# Patient Record
Sex: Female | Born: 1995 | Race: Black or African American | Hispanic: No | Marital: Married | Smoking: Never smoker
Health system: Southern US, Community
[De-identification: ages and names within clinical notes are randomized; demographics above are authoritative.]

## PROBLEM LIST (undated history)

## (undated) DIAGNOSIS — K5900 Constipation, unspecified: Secondary | ICD-10-CM

## (undated) DIAGNOSIS — J45909 Unspecified asthma, uncomplicated: Secondary | ICD-10-CM

## (undated) DIAGNOSIS — R87610 Atypical squamous cells of undetermined significance on cytologic smear of cervix (ASC-US): Secondary | ICD-10-CM

## (undated) DIAGNOSIS — T7840XA Allergy, unspecified, initial encounter: Secondary | ICD-10-CM

## (undated) DIAGNOSIS — J302 Other seasonal allergic rhinitis: Secondary | ICD-10-CM

## (undated) HISTORY — DX: Constipation, unspecified: K59.00

## (undated) HISTORY — DX: Atypical squamous cells of undetermined significance on cytologic smear of cervix (ASC-US): R87.610

## (undated) HISTORY — DX: Allergy, unspecified, initial encounter: T78.40XA

## (undated) HISTORY — PX: WISDOM TOOTH EXTRACTION: SHX21

---
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
## 2015-03-03 IMAGING — CR DG WRIST COMPLETE 3+V*L*
4 series · 4 of 4 positions shown · non-contrast
Comparison: None.

CLINICAL DATA: Left wrist pain.

LEFT WRIST - COMPLETE 3+ VIEW

[x wrist pa left]
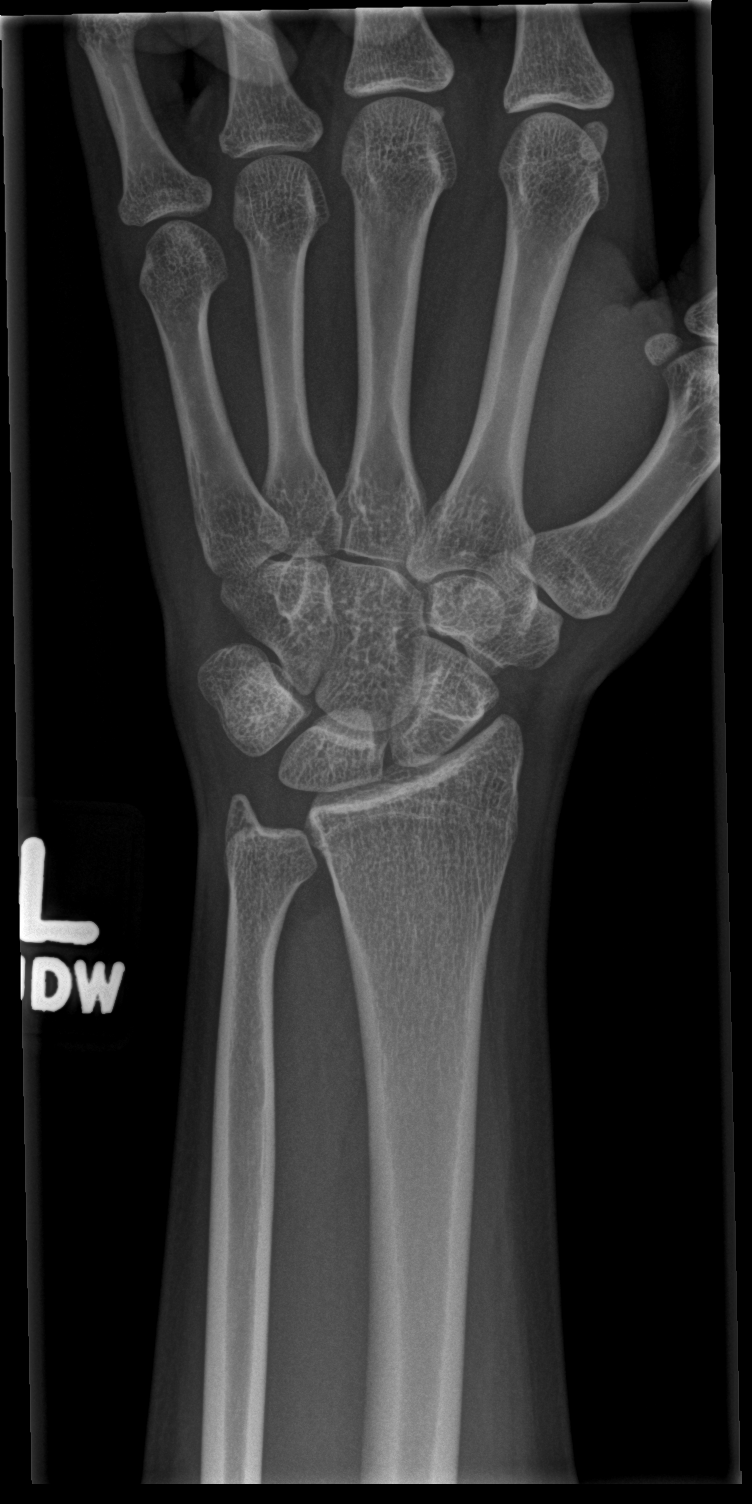

[x wrist obl left]
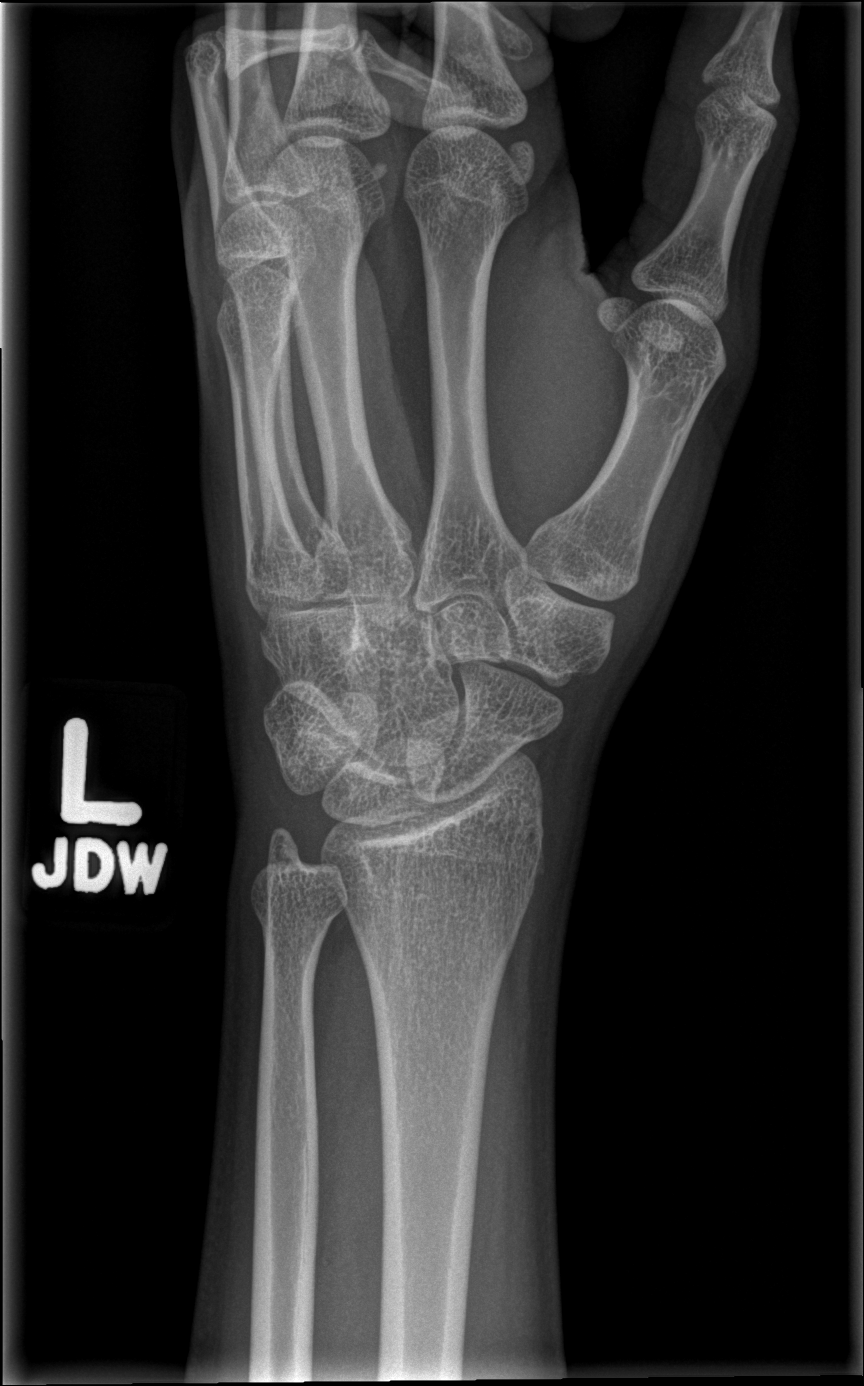

[x wrist lat left]
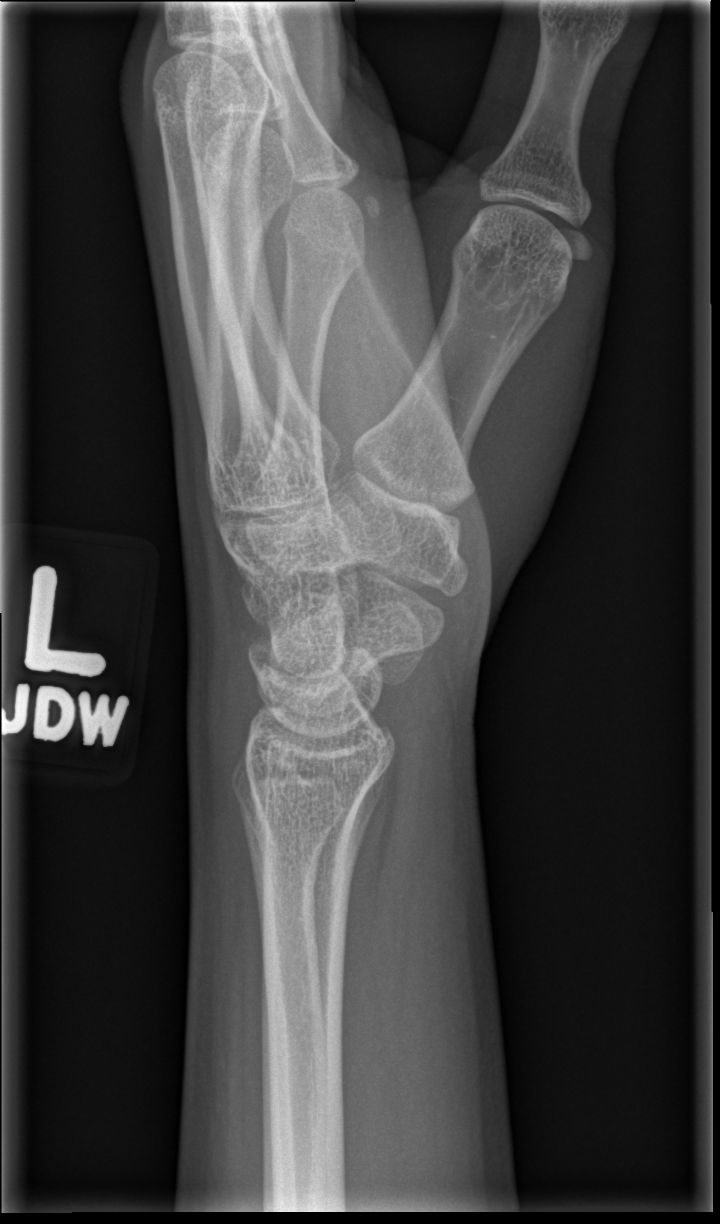

[x wrist navicular view left]
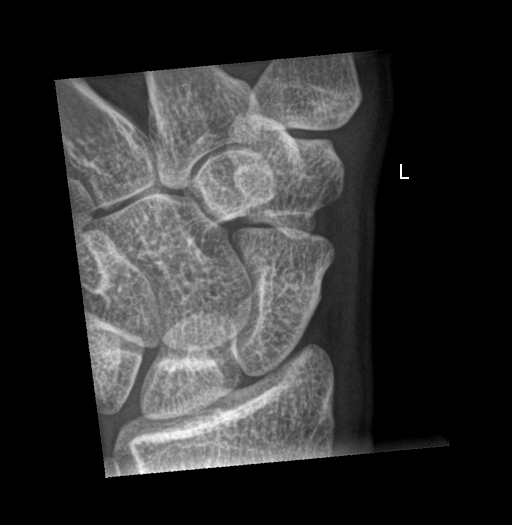

[4 of 4 positions shown; findings below may reference images not displayed]

FINDINGS: Four views of the left wrist were obtained.  No evidence
for fracture or dislocation.  Alignment of the wrist is within
normal limits.  No gross soft tissue abnormality.
IMPRESSION: No acute bony abnormality.

## 2017-01-09 ENCOUNTER — Other Ambulatory Visit: Payer: Self-pay | Admitting: General Practice

## 2017-01-09 ENCOUNTER — Encounter: Payer: Self-pay | Admitting: General Practice

## 2017-04-29 ENCOUNTER — Ambulatory Visit: Payer: Medicaid Other | Admitting: Family Medicine

## 2019-07-01 ENCOUNTER — Other Ambulatory Visit: Payer: Self-pay

## 2019-07-01 ENCOUNTER — Ambulatory Visit (INDEPENDENT_AMBULATORY_CARE_PROVIDER_SITE_OTHER): Payer: 59 | Admitting: Physician Assistant

## 2019-07-01 ENCOUNTER — Encounter: Payer: Self-pay | Admitting: Physician Assistant

## 2019-07-01 VITALS — BP 100/60 | HR 81 | Temp 98.3°F | Resp 14 | Ht 65.0 in | Wt 127.0 lb

## 2019-07-01 DIAGNOSIS — R300 Dysuria: Secondary | ICD-10-CM

## 2019-07-01 LAB — POCT URINALYSIS DIPSTICK
Bilirubin, UA: NEGATIVE
Blood, UA: NEGATIVE
Glucose, UA: NEGATIVE
Ketones, UA: NEGATIVE
Leukocytes, UA: NEGATIVE
Nitrite, UA: NEGATIVE
Protein, UA: POSITIVE — AB
Spec Grav, UA: 1.025 (ref 1.010–1.025)
Urobilinogen, UA: 0.2 E.U./dL
pH, UA: 6 (ref 5.0–8.0)

## 2019-07-01 MED ORDER — CEPHALEXIN 500 MG PO CAPS: 500.0000 mg | ORAL_CAPSULE | Freq: Two times a day (BID) | ORAL | 0 refills | Status: AC

## 2019-07-01 NOTE — Patient Instructions (Signed)
Your symptoms are consistent with a bladder infection, also called acute cystitis. Please take your antibiotic (Keflex) as directed until all pills are gone.  Stay very well hydrated.  Consider a daily probiotic (Align, Culturelle, or Activia) to help prevent stomach upset caused by the antibiotic.  Taking a probiotic daily may also help prevent recurrent UTIs.  Also consider taking AZO (Phenazopyridine) tablets to help decrease pain with urination.  I will call you with your urine testing results.  We will change antibiotics if indicated.  Call or return to clinic if symptoms are not resolved by completion of antibiotic.   Urinary Tract Infection A urinary tract infection (UTI) can occur any place along the urinary tract. The tract includes the kidneys, ureters, bladder, and urethra. A type of germ called bacteria often causes a UTI. UTIs are often helped with antibiotic medicine.  HOME CARE   If given, take antibiotics as told by your doctor. Finish them even if you start to feel better.  Drink enough fluids to keep your pee (urine) clear or pale yellow.  Avoid tea, drinks with caffeine, and bubbly (carbonated) drinks.  Pee often. Avoid holding your pee in for a long time.  Pee before and after having sex (intercourse).  Wipe from front to back after you poop (bowel movement) if you are a woman. Use each tissue only once. GET HELP RIGHT AWAY IF:   You have back pain.  You have lower belly (abdominal) pain.  You have chills.  You feel sick to your stomach (nauseous).  You throw up (vomit).  Your burning or discomfort with peeing does not go away.  You have a fever.  Your symptoms are not better in 3 days. MAKE SURE YOU:   Understand these instructions.  Will watch your condition.  Will get help right away if you are not doing well or get worse. Document Released: 03/12/2008 Document Revised: 06/18/2012 Document Reviewed: 04/24/2012 Exit[employer] Patient Information 2015  [employer]. This information is not intended to replace advice given to you by your health care provider. Make sure you discuss any questions you have with your health care provider.   

## 2019-07-01 NOTE — Progress Notes (Signed)
PCP:No primary care provider on file. Chief Complaint  Patient presents with  . Urinary Tract Infection    Started 1 week ago. Started having low back pain, pressure, frequency, dysuria, some light blood. Does not hydrates as well.     Current Issues:  Presents with 7 days of dysuria, urinary urgency and urinary frequency. Associated symptoms include:  cloudy urine, nausea and suprapubic pressure; hematuria (faint). Denies fever, chills, vomiting or flank pain. LMP finished 1.5 weeks ago. Denies vaginal symptoms.   There is a previous history of of similar symptoms. Sexually active:  No   No concern for STI.  Prior to Admission medications   Medication Sig Start Date End Date Taking? Authorizing Provider  Adapalene 0.3 % gel APPLY A SMALL AMOUNT TO SKIN EVERY OTHER DAY 04/20/19  Yes [provider]    Review of Systems:Pertinent ROS are listed in the HPI  PE:  BP 100/60   Pulse 81   Temp 98.3 F (36.8 C) (Skin)   Resp 14   Ht 5\' 5"  (1.651 m)   Wt 127 lb (57.6 kg)   SpO2 100%   BMI 21.13 kg/m    Results for orders placed or performed in visit on 07/01/19  POCT Urinalysis Dipstick  Result Value Ref Range   Color, UA yellow    Clarity, UA clear    Glucose, UA Negative Negative   Bilirubin, UA negative    Ketones, UA negative    Spec Grav, UA 1.025 1.010 - 1.025   Blood, UA negative    pH, UA 6.0 5.0 - 8.0   Protein, UA Positive (A) Negative   Urobilinogen, UA 0.2 0.2 or 1.0 E.U./dL   Nitrite, UA negative    Leukocytes, UA Negative Negative   Appearance     Odor      Assessment and Plan:  1. Dysuria Urine dip with trace protein. Otherwise unremarkable. Giving classic UTI symptoms will start empiric treatment for UTI with Keflex BID. Urine culture sent. Increase fluids. Supportive measures and return precautions reviewed with patient.  - POCT Urinalysis Dipstick

## 2019-07-03 LAB — URINE CULTURE
MICRO NUMBER:: 916666
SPECIMEN QUALITY:: ADEQUATE

## 2019-08-12 ENCOUNTER — Ambulatory Visit (INDEPENDENT_AMBULATORY_CARE_PROVIDER_SITE_OTHER): Payer: 59 | Admitting: Family Medicine

## 2019-08-12 ENCOUNTER — Encounter: Payer: Self-pay | Admitting: Family Medicine

## 2019-08-12 ENCOUNTER — Other Ambulatory Visit: Payer: Self-pay

## 2019-08-12 VITALS — BP 100/68 | HR 68 | Temp 97.9°F | Resp 16 | Ht 62.5 in | Wt 126.2 lb

## 2019-08-12 DIAGNOSIS — Z0000 Encounter for general adult medical examination without abnormal findings: Secondary | ICD-10-CM | POA: Diagnosis not present

## 2019-08-12 DIAGNOSIS — Z23 Encounter for immunization: Secondary | ICD-10-CM

## 2019-08-12 NOTE — Addendum Note (Signed)
Addended by: Davis Gourd on: 08/12/2019 01:52 PM   Modules accepted: Orders

## 2019-08-12 NOTE — Patient Instructions (Signed)
Schedule your pap at your convenience No need for labs today- you look great! Call with any questions or concerns Stay Safe!!  Stay Healthy!! Welcome!  We're glad to have you!!

## 2019-08-12 NOTE — Progress Notes (Signed)
   Subjective:    Patient ID: Caitlin Velazquez, female    DOB: 10-27-1995, 23 y.o.   MRN: [id number]  HPI CPE- Due for flu and Tdap.  No recent pap.  Pt has been sexually active previously, not currently.  No smoking or vaping.  No drug use.  Feels safe in relationship.  Programmer, systems- graduated w/ degree in Therapist, occupational.     Review of Systems Patient reports no vision/ hearing changes, adenopathy,fever, weight change,  persistant/recurrent hoarseness , swallowing issues, chest pain, palpitations, edema, persistant/recurrent cough, hemoptysis, dyspnea (rest/exertional/paroxysmal nocturnal), gastrointestinal bleeding (melena, rectal bleeding), abdominal pain, significant heartburn, bowel changes, GU symptoms (dysuria, hematuria, incontinence), Gyn symptoms (abnormal  bleeding, pain),  syncope, focal weakness, memory loss, numbness & tingling, skin/hair/nail changes, abnormal bruising or bleeding, anxiety, or depression.     Objective:   Physical Exam General Appearance:    Alert, cooperative, no distress, appears stated age  Head:    Normocephalic, without obvious abnormality, atraumatic  Eyes:    PERRL, conjunctiva/corneas clear, EOM's intact, fundi    benign, both eyes  Ears:    Normal TM's and external ear canals, both ears  Nose:   Deferred due to COVID  Throat:   Neck:   Supple, symmetrical, trachea midline, no adenopathy;    Thyroid: no enlargement/tenderness/nodules  Back:     Symmetric, no curvature, ROM normal, no CVA tenderness  Lungs:     Clear to auscultation bilaterally, respirations unlabored  Chest Wall:    No tenderness or deformity   Heart:    Regular rate and rhythm, S1 and S2 normal, no murmur, rub   or gallop  Breast Exam:    Deferred to GYN  Abdomen:     Soft, non-tender, bowel sounds active all four quadrants,    no masses, no organomegaly  Genitalia:    Deferred to GYN  Rectal:    Extremities:   Extremities normal, atraumatic, no cyanosis or  edema  Pulses:   2+ and symmetric all extremities  Skin:   Skin color, texture, turgor normal, no rashes or lesions  Lymph nodes:   Cervical, supraclavicular, and axillary nodes normal  Neurologic:   CNII-XII intact, normal strength, sensation and reflexes    throughout          Assessment & Plan:

## 2019-08-12 NOTE — Assessment & Plan Note (Signed)
Pt's PE WNL.  Tdap and flu given.  Pt has never had a pap- will schedule.  No need for labs due to lack of risk factors.  Anticipatory guidance provided.

## 2019-08-31 ENCOUNTER — Ambulatory Visit (INDEPENDENT_AMBULATORY_CARE_PROVIDER_SITE_OTHER): Payer: 59 | Admitting: Family Medicine

## 2019-08-31 ENCOUNTER — Other Ambulatory Visit: Payer: Self-pay

## 2019-08-31 ENCOUNTER — Other Ambulatory Visit (HOSPITAL_COMMUNITY)
Admission: RE | Admit: 2019-08-31 | Discharge: 2019-08-31 | Disposition: A | Discharge status: Home / Self Care | Payer: 59 | Source: Ambulatory Visit | Attending: Family Medicine | Admitting: Family Medicine

## 2019-08-31 ENCOUNTER — Encounter: Payer: Self-pay | Admitting: Family Medicine

## 2019-08-31 VITALS — BP 120/80 | HR 71 | Temp 97.1°F | Resp 16 | Ht 63.0 in | Wt 125.4 lb

## 2019-08-31 DIAGNOSIS — R87610 Atypical squamous cells of undetermined significance on cytologic smear of cervix (ASC-US): Secondary | ICD-10-CM | POA: Insufficient documentation

## 2019-08-31 DIAGNOSIS — Z124 Encounter for screening for malignant neoplasm of cervix: Secondary | ICD-10-CM | POA: Insufficient documentation

## 2019-08-31 DIAGNOSIS — Z1151 Encounter for screening for human papillomavirus (HPV): Secondary | ICD-10-CM | POA: Insufficient documentation

## 2019-08-31 NOTE — Progress Notes (Signed)
   Subjective:    Patient ID: Caitlin Velazquez, female    DOB: 04-Feb-1996, 23 y.o.   MRN: [id number]  HPI Pap- no vaginal concerns.  No d/c, pain, abnormal bleeding.  Regular cycles.  No concerns for STIs.  Declines testing.   Review of Systems For ROS see HPI     Objective:   Physical Exam Vitals signs reviewed. Exam conducted with a chaperone present.  Constitutional:      Appearance: Normal appearance.  HENT:     Head: Normocephalic and atraumatic.  Genitourinary:    General: Normal vulva.     Pubic Area: No rash.      Labia:        Right: No rash, tenderness or lesion.        Left: No rash, tenderness or lesion.      Urethra: No prolapse, urethral pain, urethral swelling or urethral lesion.     Vagina: Normal. No signs of injury. No vaginal discharge, erythema, tenderness, bleeding, lesions or prolapsed vaginal walls.     Cervix: Friability (mild) and erythema (mild redness around os) present. No cervical motion tenderness, discharge or cervical bleeding.     Uterus: Normal. Not deviated, not enlarged, not fixed and not tender.      Adnexa: Right adnexa normal and left adnexa normal.       Right: No mass or tenderness.         Left: No mass or tenderness.       Rectum: Normal.  Neurological:     Mental Status: She is alert.           Assessment & Plan:  Pap- pt tolerated 1st pap w/o difficulty.  Will determine f/u schedule based on results.  Pt expressed understanding and is in agreement w/ plan.

## 2019-08-31 NOTE — Patient Instructions (Signed)
Follow up in 1 year for your physical We'll notify you of your pap results Call with any questions or concerns Happy Thanksgiving!!!

## 2019-09-07 ENCOUNTER — Other Ambulatory Visit: Payer: Self-pay | Admitting: Family Medicine

## 2019-09-07 DIAGNOSIS — R87610 Atypical squamous cells of undetermined significance on cytologic smear of cervix (ASC-US): Secondary | ICD-10-CM

## 2019-09-07 LAB — CYTOLOGY - PAP
Comment: NEGATIVE
Diagnosis: UNDETERMINED — AB
High risk HPV: NEGATIVE

## 2019-10-07 ENCOUNTER — Other Ambulatory Visit: Payer: Self-pay

## 2019-10-08 ENCOUNTER — Ambulatory Visit (INDEPENDENT_AMBULATORY_CARE_PROVIDER_SITE_OTHER): Payer: No Typology Code available for payment source | Admitting: Obstetrics & Gynecology

## 2019-10-08 ENCOUNTER — Encounter: Payer: Self-pay | Admitting: Obstetrics & Gynecology

## 2019-10-08 VITALS — BP 110/74 | Ht 63.0 in | Wt 128.0 lb

## 2019-10-08 DIAGNOSIS — R87610 Atypical squamous cells of undetermined significance on cytologic smear of cervix (ASC-US): Secondary | ICD-10-CM

## 2019-10-08 NOTE — Patient Instructions (Signed)
1. ASCUS of cervix with negative high risk HPV ASCUS/HPV HR negative 08/2019.  Gynecologic exam normal today.  Declines STI screen.  Counseling on abnormal Pap/HPV done.  Strict usage of condoms strongly recommended. F/U repeat Pap test in 5 months.  Caitlin Velazquez, it was a pleasure meeting you today!

## 2019-10-08 NOTE — Progress Notes (Signed)
    Caitlin Velazquez 28-Nov-1995 [id number]   History:    23 y.o. G0 Engaged.  RP:  Nes patient presenting for ASCUS/HPV HR negative   HPI:  Menses regular normal.  Long distance relationship.  Condoms for contraception.  No pelvic pain.  Last Pap 08/2019 ASCUS/HPV HR negative.  Never had an STI.  STI screen declined.  Past medical history,surgical history, family history and social history were all reviewed and documented in the EPIC chart.  Gynecologic History Patient's last menstrual period was 10/01/2019.  Obstetric History OB History  Gravida Para Term Preterm AB Living  0 0 0 0 0 0  SAB TAB Ectopic Multiple Live Births  0 0 0 0 0     ROS: A ROS was performed and pertinent positives and negatives are included in the history.  GENERAL: No fevers or chills. HEENT: No change in vision, no earache, sore throat or sinus congestion. NECK: No pain or stiffness. CARDIOVASCULAR: No chest pain or pressure. No palpitations. PULMONARY: No shortness of breath, cough or wheeze. GASTROINTESTINAL: No abdominal pain, nausea, vomiting or diarrhea, melena or bright red blood per rectum. GENITOURINARY: No urinary frequency, urgency, hesitancy or dysuria. MUSCULOSKELETAL: No joint or muscle pain, no back pain, no recent trauma. DERMATOLOGIC: No rash, no itching, no lesions. ENDOCRINE: No polyuria, polydipsia, no heat or cold intolerance. No recent change in weight. HEMATOLOGICAL: No anemia or easy bruising or bleeding. NEUROLOGIC: No headache, seizures, numbness, tingling or weakness. PSYCHIATRIC: No depression, no loss of interest in normal activity or change in sleep pattern.     Exam:   BP 110/74 (BP Location: Right Arm, Patient Position: Sitting, Cuff Size: Normal)   Ht 5\' 3"  (1.6 m)   Wt 128 lb (58.1 kg)   LMP 10/01/2019   BMI 22.67 kg/m   Body mass index is 22.67 kg/m.  General appearance : Well developed well nourished female. No acute distress  Pelvic: Vulva: Normal  Vagina: No gross lesions or discharge  Cervix: No gross lesions or discharge  Uterus AV, normal size, shape and consistency, non-tender and mobile  Adnexa  Without masses or tenderness  Anus: Normal   Assessment/Plan:  23 y.o. female for annual exam   1. ASCUS of cervix with negative high risk HPV ASCUS/HPV HR negative 08/2019.  Gynecologic exam normal today.  Declines STI screen.  Counseling on abnormal Pap/HPV done.  Strict usage of condoms strongly recommended. F/U repeat Pap test in 5 months.  Counseling on above issues and coordination of care >50% x 30 minutes.  [name], 3:37 PM 10/08/2019

## 2020-02-29 ENCOUNTER — Other Ambulatory Visit: Payer: Self-pay

## 2020-02-29 ENCOUNTER — Encounter: Payer: Self-pay | Admitting: Obstetrics & Gynecology

## 2020-02-29 ENCOUNTER — Ambulatory Visit (INDEPENDENT_AMBULATORY_CARE_PROVIDER_SITE_OTHER): Payer: 59 | Admitting: Obstetrics & Gynecology

## 2020-02-29 VITALS — BP 102/68

## 2020-02-29 DIAGNOSIS — R87610 Atypical squamous cells of undetermined significance on cytologic smear of cervix (ASC-US): Secondary | ICD-10-CM | POA: Diagnosis not present

## 2020-02-29 DIAGNOSIS — Z30011 Encounter for initial prescription of contraceptive pills: Secondary | ICD-10-CM | POA: Diagnosis not present

## 2020-02-29 MED ORDER — NORETHIN ACE-ETH ESTRAD-FE 1-20 MG-MCG(24) PO TABS: 1.0000 | ORAL_TABLET | Freq: Every day | ORAL | 4 refills | Status: DC

## 2020-02-29 NOTE — Progress Notes (Signed)
    Caitlin Velazquez [birth year] [id number]        24 y.o.  G0P0000 Engaged  RP: Repeat Pap test at 6 months  HPI: ASCUS/HPV HR Negative 08/2019.  Menses regular normal except for severe dysmenorrhea.  NSAIDs not sufficient.  Long distance relationship.  Condoms for contraception.  No pelvic pain.  Last Pap 08/2019 ASCUS/HPV HR negative.  Never had an STI. STI screen declined.    OB History  Gravida Para Term Preterm AB Living  0 0 0 0 0 0  SAB TAB Ectopic Multiple Live Births  0 0 0 0 0    Past medical history,surgical history, problem list, medications, allergies, family history and social history were all reviewed and documented in the EPIC chart.   Directed ROS with pertinent positives and negatives documented in the history of present illness/assessment and plan.  Exam:  Vitals:   02/29/20 1527  BP: 102/68   General appearance:  Normal  Abdomen: Normal  Gynecologic exam: Vulva normal.  Speculum:  Cervix normal.  Pap reflex done.  Vagina normal.   Assessment/Plan:  24 y.o. G0   1. ASCUS of cervix with negative high risk HPV Pap test November 2020 showed ASCUS with negative high-risk HPV.  Pap reflex done today.  Declines STI screen.  2. Encounter for initial prescription of contraceptive pills Dysmenorrhea not controlled with NSAIDs.  Decision to start on birth control pills.  No contraindication to birth control pills.  Continuous use for dysmenorrhea.  Prescription sent to pharmacy.  Other orders - Norethindrone Acetate-Ethinyl Estrad-FE (LOESTRIN 24 FE) 1-20 MG-MCG(24) tablet; Take 1 tablet by mouth daily.  Genia Del MD, 4:06 PM 02/29/2020

## 2020-03-01 LAB — PAP IG W/ RFLX HPV ASCU

## 2020-03-05 ENCOUNTER — Encounter: Payer: Self-pay | Admitting: Obstetrics & Gynecology

## 2020-03-05 NOTE — Patient Instructions (Signed)
1. ASCUS of cervix with negative high risk HPV Pap test November 2020 showed ASCUS with negative high-risk HPV.  Pap reflex done today.  Declines STI screen.  2. Encounter for initial prescription of contraceptive pills Dysmenorrhea not controlled with NSAIDs.  Decision to start on birth control pills.  No contraindication to birth control pills.  Continuous use for dysmenorrhea.  Prescription sent to pharmacy.  Other orders - Norethindrone Acetate-Ethinyl Estrad-FE (LOESTRIN 24 FE) 1-20 MG-MCG(24) tablet; Take 1 tablet by mouth daily.  Caitlin Velazquez, it was a pleasure seeing you today!  I will inform you of your results as soon as they are available.

## 2020-12-15 ENCOUNTER — Encounter: Payer: Self-pay | Admitting: Family Medicine

## 2020-12-27 ENCOUNTER — Encounter: Payer: Self-pay | Admitting: Family Medicine

## 2020-12-27 ENCOUNTER — Other Ambulatory Visit: Payer: Self-pay

## 2020-12-27 ENCOUNTER — Ambulatory Visit (INDEPENDENT_AMBULATORY_CARE_PROVIDER_SITE_OTHER): Payer: No Typology Code available for payment source | Admitting: Family Medicine

## 2020-12-27 VITALS — BP 110/80 | HR 77 | Temp 97.7°F | Resp 18 | Ht 64.0 in | Wt 131.8 lb

## 2020-12-27 DIAGNOSIS — Z0000 Encounter for general adult medical examination without abnormal findings: Secondary | ICD-10-CM | POA: Diagnosis not present

## 2020-12-27 MED ORDER — DICYCLOMINE HCL 20 MG PO TABS: 20.0000 mg | ORAL_TABLET | Freq: Three times a day (TID) | ORAL | 1 refills | Status: DC

## 2020-12-27 NOTE — Patient Instructions (Addendum)
Follow up in 1 year or as needed No need for labs today based on risk factors Keep up the good work on healthy diet and regular exercise- you look great!! Start a daily probiotic Use the dicyclomine as needed for abdominal cramping/spasm Call with any questions or concerns Stay Safe!  Stay Healthy! CONGRATS!!!

## 2020-12-27 NOTE — Assessment & Plan Note (Signed)
Pt's PE WNL.  UTD on Tdap, COVID.  Following w/ GYN for pap.  No need for labs based on lack of risk factors.  Anticipatory guidance provided.

## 2020-12-27 NOTE — Progress Notes (Signed)
   Subjective:    Patient ID: Caitlin Velazquez, female    DOB: 04-20-1996, 25 y.o.   MRN: [id number]  HPI CPE- UTD on pap, COVID, Tdap.  Declines flu.  Now engaged  Reviewed past medical, surgical, family and social histories.   Patient Care Team    Relationship Specialty Notifications Start End  Sheliah Hatch, MD PCP - General Family Medicine  08/12/19   Genia Del, MD Consulting Physician Obstetrics and Gynecology  12/27/20      Health Maintenance  Topic Date Due  . COVID-19 Vaccine (1) Never done  . INFLUENZA VACCINE  02/12/2021 (Originally 05/08/2020)  . HPV VACCINES (2 - 3-dose series) 12/27/2021 (Originally 05/19/2015)  . Hepatitis C Screening  12/27/2021 (Originally 07[birth year]19/96)  . HIV Screening  12/27/2021 (Originally 08/04/2011)  . PAP SMEAR-Modifier  08/30/2022  . PAP-Cervical Cytology Screening  03/01/2023  . TETANUS/TDAP  08/11/2029      Review of Systems Patient reports no vision/ hearing changes, adenopathy,fever, weight change,  persistant/recurrent hoarseness , swallowing issues, chest pain, palpitations, edema, persistant/recurrent cough, hemoptysis, dyspnea (rest/exertional/paroxysmal nocturnal), gastrointestinal bleeding (melena, rectal bleeding), abdominal pain, significant heartburn, GU symptoms (dysuria, hematuria, incontinence), Gyn symptoms (abnormal  bleeding, pain),  syncope, focal weakness, memory loss, numbness & tingling, skin/hair/nail changes, abnormal bruising or bleeding, anxiety, or depression.   + gas, bloating, distension, constipation.  + cramping.  This visit occurred during the SARS-CoV-2 public health emergency.  Safety protocols were in place, including screening questions prior to the visit, additional usage of staff PPE, and extensive cleaning of exam room while observing appropriate contact time as indicated for disinfecting solutions.       Objective:   Physical Exam General Appearance:    Alert, cooperative, no distress,  appears stated age  Head:    Normocephalic, without obvious abnormality, atraumatic  Eyes:    PERRL, conjunctiva/corneas clear, EOM's intact, fundi    benign, both eyes  Ears:    Normal TM's and external ear canals, both ears  Nose:   Deferred due to COVID  Throat:   Neck:   Supple, symmetrical, trachea midline, no adenopathy;    Thyroid: no enlargement/tenderness/nodules  Back:     Symmetric, no curvature, ROM normal, no CVA tenderness  Lungs:     Clear to auscultation bilaterally, respirations unlabored  Chest Wall:    No tenderness or deformity   Heart:    Regular rate and rhythm, S1 and S2 normal, no murmur, rub   or gallop  Breast Exam:    Deferred to GYN  Abdomen:     Soft, non-tender, bowel sounds active all four quadrants,    no masses, no organomegaly  Genitalia:    Deferred to GYN  Rectal:    Extremities:   Extremities normal, atraumatic, no cyanosis or edema  Pulses:   2+ and symmetric all extremities  Skin:   Skin color, texture, turgor normal, no rashes or lesions  Lymph nodes:   Cervical, supraclavicular, and axillary nodes normal  Neurologic:   CNII-XII intact, normal strength, sensation and reflexes    throughout          Assessment & Plan:

## 2021-01-03 ENCOUNTER — Other Ambulatory Visit: Payer: Self-pay | Admitting: Family Medicine

## 2021-01-30 ENCOUNTER — Telehealth: Payer: Self-pay

## 2021-01-30 MED ORDER — NORETHIN ACE-ETH ESTRAD-FE 1-20 MG-MCG(24) PO TABS: 1.0000 | ORAL_TABLET | Freq: Every day | ORAL | 0 refills | Status: DC

## 2021-01-30 NOTE — Telephone Encounter (Signed)
Last AEX 02/29/20 AEX scheduled 05/10/2021  Requests bcp refill until visit. Refills sent.

## 2021-04-05 ENCOUNTER — Encounter: Payer: Self-pay | Admitting: *Deleted

## 2021-04-28 ENCOUNTER — Other Ambulatory Visit: Payer: Self-pay | Admitting: Obstetrics & Gynecology

## 2021-04-28 NOTE — Telephone Encounter (Signed)
Annual exam scheduled on 05/16/21 

## 2021-05-09 ENCOUNTER — Other Ambulatory Visit: Payer: Self-pay | Admitting: Obstetrics & Gynecology

## 2021-05-09 NOTE — Telephone Encounter (Signed)
Annual exam scheduled on 05/16/21, insurance is requiring 90 day supply. Rx sent.

## 2021-05-10 ENCOUNTER — Ambulatory Visit: Payer: Self-pay | Admitting: Obstetrics & Gynecology

## 2021-05-16 ENCOUNTER — Ambulatory Visit (INDEPENDENT_AMBULATORY_CARE_PROVIDER_SITE_OTHER): Payer: No Typology Code available for payment source | Admitting: Obstetrics & Gynecology

## 2021-05-16 ENCOUNTER — Encounter: Payer: Self-pay | Admitting: Obstetrics & Gynecology

## 2021-05-16 ENCOUNTER — Other Ambulatory Visit: Payer: Self-pay

## 2021-05-16 ENCOUNTER — Other Ambulatory Visit (HOSPITAL_COMMUNITY)
Admission: RE | Admit: 2021-05-16 | Discharge: 2021-05-16 | Disposition: A | Discharge status: Home / Self Care | Payer: No Typology Code available for payment source | Source: Ambulatory Visit | Attending: Obstetrics & Gynecology | Admitting: Obstetrics & Gynecology

## 2021-05-16 VITALS — BP 116/70 | HR 90 | Resp 16 | Ht 63.25 in | Wt 132.0 lb

## 2021-05-16 DIAGNOSIS — Z789 Other specified health status: Secondary | ICD-10-CM

## 2021-05-16 DIAGNOSIS — Z01419 Encounter for gynecological examination (general) (routine) without abnormal findings: Secondary | ICD-10-CM

## 2021-05-16 DIAGNOSIS — N946 Dysmenorrhea, unspecified: Secondary | ICD-10-CM

## 2021-05-16 NOTE — Progress Notes (Signed)
Caitlin Velazquez December 08, 1995 [id number]   History:    25 y.o.  G0 Engaged   RP:  Established patient presenting for annual gyn exam    HPI: Stopped BCPs 2 months ago because of low moods.  Menses regular normal except for recurring dysmenorrhea for which NSAIDs is currently helpful  Condoms for contraception.  No pain with IC.  Last Pap 02/2020 Negative.  STI screen declined.  Breasts normal.  BMI 23.2.    Past medical history,surgical history, family history and social history were all reviewed and documented in the EPIC chart.  Gynecologic History Patient's last menstrual period was 05/09/2021 (exact date).  Obstetric History OB History  Gravida Para Term Preterm AB Living  0 0 0 0 0 0  SAB IAB Ectopic Multiple Live Births  0 0 0 0 0     ROS: A ROS was performed and pertinent positives and negatives are included in the history.  GENERAL: No fevers or chills. HEENT: No change in vision, no earache, sore throat or sinus congestion. NECK: No pain or stiffness. CARDIOVASCULAR: No chest pain or pressure. No palpitations. PULMONARY: No shortness of breath, cough or wheeze. GASTROINTESTINAL: No abdominal pain, nausea, vomiting or diarrhea, melena or bright red blood per rectum. GENITOURINARY: No urinary frequency, urgency, hesitancy or dysuria. MUSCULOSKELETAL: No joint or muscle pain, no back pain, no recent trauma. DERMATOLOGIC: No rash, no itching, no lesions. ENDOCRINE: No polyuria, polydipsia, no heat or cold intolerance. No recent change in weight. HEMATOLOGICAL: No anemia or easy bruising or bleeding. NEUROLOGIC: No headache, seizures, numbness, tingling or weakness. PSYCHIATRIC: No depression, no loss of interest in normal activity or change in sleep pattern.     Exam:   BP 116/70   Pulse 90   Resp 16   Ht 5' 3.25" (1.607 m)   Wt 132 lb (59.9 kg)   LMP 05/09/2021 (Exact Date) Comment: uses condoms  BMI 23.20 kg/m   Body mass index is 23.2 kg/m.  General appearance :  Well developed well nourished female. No acute distress HEENT: Eyes: no retinal hemorrhage or exudates,  Neck supple, trachea midline, no carotid bruits, no thyroidmegaly Lungs: Clear to auscultation, no rhonchi or wheezes, or rib retractions  Heart: Regular rate and rhythm, no murmurs or gallops Breast:Examined in sitting and supine position were symmetrical in appearance, no palpable masses or tenderness,  no skin retraction, no nipple inversion, no nipple discharge, no skin discoloration, no axillary or supraclavicular lymphadenopathy Abdomen: no palpable masses or tenderness, no rebound or guarding Extremities: no edema or skin discoloration or tenderness  Pelvic: Vulva: Normal             Vagina: No gross lesions or discharge  Cervix: No gross lesions or discharge.  Pap reflex done.  Uterus  AV, normal size, shape and consistency, non-tender and mobile  Adnexa  Without masses or tenderness  Anus: Normal   Assessment/Plan:  25 y.o. female for annual exam   1. Encounter for routine gynecological examination with Papanicolaou smear of cervix Normal gynecologic exam.  Pap reflex done.  Breast exam normal.  Good body mass index at 23.2.  Continue with fitness and healthy nutrition. - Cytology - PAP( [place])  2. Use of condoms for contraception  Will use condoms at this time.    3. Dysmenorrhea  Dysmenorrhea was controlled on the birth control pills, but had to stop because of low moods.  Currently well with NSAIDs as needed.  Will call back if no  longer controlled for counseling on management.  Genia Del MD, 4:04 PM 05/16/2021

## 2021-05-19 LAB — CYTOLOGY - PAP: Diagnosis: NEGATIVE

## 2021-10-27 DIAGNOSIS — F411 Generalized anxiety disorder: Secondary | ICD-10-CM | POA: Diagnosis not present

## 2021-10-30 DIAGNOSIS — J3089 Other allergic rhinitis: Secondary | ICD-10-CM | POA: Diagnosis not present

## 2021-11-23 DIAGNOSIS — F411 Generalized anxiety disorder: Secondary | ICD-10-CM | POA: Diagnosis not present

## 2021-12-29 ENCOUNTER — Encounter: Payer: Self-pay | Admitting: Family Medicine

## 2021-12-29 ENCOUNTER — Ambulatory Visit (INDEPENDENT_AMBULATORY_CARE_PROVIDER_SITE_OTHER): Payer: BC Managed Care – PPO | Admitting: Family Medicine

## 2021-12-29 VITALS — BP 102/64 | HR 73 | Temp 98.0°F | Resp 18 | Ht 63.0 in | Wt 126.4 lb

## 2021-12-29 DIAGNOSIS — K589 Irritable bowel syndrome without diarrhea: Secondary | ICD-10-CM | POA: Insufficient documentation

## 2021-12-29 DIAGNOSIS — Z0000 Encounter for general adult medical examination without abnormal findings: Secondary | ICD-10-CM

## 2021-12-29 DIAGNOSIS — K581 Irritable bowel syndrome with constipation: Secondary | ICD-10-CM | POA: Diagnosis not present

## 2021-12-29 MED ORDER — POLYETHYLENE GLYCOL 3350 17 GM/SCOOP PO POWD: 17.0000 g | Freq: Every day | ORAL | 3 refills | Status: DC

## 2021-12-29 NOTE — Assessment & Plan Note (Signed)
Ongoing issue for pt.  No relief w/ dicyclomine.  Discussed constipation predominant sxs and need for daily Miralax.  Pt can add fiber supplement once things are moving but not before as she will develop severe and painful gas.  Pt expressed understanding and is in agreement w/ plan.  ?

## 2021-12-29 NOTE — Progress Notes (Signed)
? ?  Subjective:  ? ? Patient ID: Caitlin Velazquez, female    DOB: [birth year], 26 y.o.   MRN: [id number] ? ?HPI ?CPE- UTD on Tdap, pap ? ?Patient Care Team  ?  Relationship Specialty Notifications Start End  ?Midge Minium, MD PCP - General Family Medicine  08/12/19   ?Princess Bruins, MD Consulting Physician Obstetrics and Gynecology  12/27/20   ?  ?Health Maintenance  ?Topic Date Due  ? INFLUENZA VACCINE  01/05/2022 (Originally 05/08/2021)  ? COVID-19 Vaccine (2 - Pfizer series) 01/14/2022 (Originally 06/20/2020)  ? HPV VACCINES (2 - 3-dose series) 12/30/2022 (Originally 05/19/2015)  ? Hepatitis C Screening  12/30/2022 (Originally 08/03/2014)  ? HIV Screening  12/30/2022 (Originally 08/04/2011)  ? PAP-Cervical Cytology Screening  05/16/2024  ? PAP SMEAR-Modifier  05/16/2024  ? TETANUS/TDAP  08/11/2029  ?  ? ? ?Review of Systems ?Patient reports no vision/ hearing changes, adenopathy,fever, weight change,  persistant/recurrent hoarseness , swallowing issues, chest pain, palpitations, edema, persistant/recurrent cough, hemoptysis, dyspnea (rest/exertional/paroxysmal nocturnal), gastrointestinal bleeding (melena, rectal bleeding), significant heartburn, GU symptoms (dysuria, hematuria, incontinence), Gyn symptoms (abnormal  bleeding, pain),  syncope, focal weakness, memory loss, numbness & tingling, skin/hair/nail changes, abnormal bruising or bleeding, anxiety, or depression. ? ?IBS- pt reports she has 'a build up of excessive gas'.  Has pain in lower abd and will have shooting pains into her upper thighs.  Menstrual cycle in addition to IBS is very painful.  Pt has constipation predominant sxs.  Sxs are now interfering w/ work.  Taking a daily probiotic, drinking kombucha.  ? ?This visit occurred during the SARS-CoV-2 public health emergency.  Safety protocols were in place, including screening questions prior to the visit, additional usage of staff PPE, and extensive cleaning of exam room while observing  appropriate contact time as indicated for disinfecting solutions.   ?   ?Objective:  ? Physical Exam ?General Appearance:    Alert, cooperative, no distress, appears stated age  ?Head:    Normocephalic, without obvious abnormality, atraumatic  ?Eyes:    PERRL, conjunctiva/corneas clear, EOM's intact, fundi  ?  benign, both eyes  ?Ears:    Normal TM's and external ear canals, both ears  ?Nose:   Deferred due to COVID  ?Throat:   ?Neck:   Supple, symmetrical, trachea midline, no adenopathy;  ?  Thyroid: no enlargement/tenderness/nodules  ?Back:     Symmetric, no curvature, ROM normal, no CVA tenderness  ?Lungs:     Clear to auscultation bilaterally, respirations unlabored  ?Chest Wall:    No tenderness or deformity  ? Heart:    Regular rate and rhythm, S1 and S2 normal, no murmur, rub ?  or gallop  ?Breast Exam:    Deferred to GYN  ?Abdomen:     Soft, non-tender, bowel sounds active all four quadrants,  ?  no masses, no organomegaly  ?Genitalia:    Deferred to GYN  ?Rectal:    ?Extremities:   Extremities normal, atraumatic, no cyanosis or edema  ?Pulses:   2+ and symmetric all extremities  ?Skin:   Skin color, texture, turgor normal, no rashes or lesions  ?Lymph nodes:   Cervical, supraclavicular, and axillary nodes normal  ?Neurologic:   CNII-XII intact, normal strength, sensation and reflexes  ?  throughout  ?  ? ? ? ?   ?Assessment & Plan:  ? ? ?

## 2021-12-29 NOTE — Assessment & Plan Note (Signed)
Pt's PE WNL.  UTD on pap, Tdap.  No need for labs based on lack of risk factors.  Anticipatory guidance provided.  ?

## 2021-12-29 NOTE — Patient Instructions (Addendum)
Follow up in 1 year or as needed ?START the Miralax daily until having regular bowel movements ?You can increase or decrease the Miralax based on need ?Continue to drink LOTS of water, regular exercise, probiotics, and once things are moving- consider a fiber supplement ?Call with any questions or concerns ?Stay Safe!  Stay Healthy! ?CONGRATS ON THE ENGAGEMENT!!! ? ? ?

## 2022-01-02 ENCOUNTER — Encounter: Payer: Self-pay | Admitting: Family Medicine

## 2022-01-02 DIAGNOSIS — K581 Irritable bowel syndrome with constipation: Secondary | ICD-10-CM

## 2022-03-28 ENCOUNTER — Encounter: Payer: Self-pay | Admitting: Family Medicine

## 2022-04-20 ENCOUNTER — Encounter: Payer: Self-pay | Admitting: Nurse Practitioner

## 2022-05-11 ENCOUNTER — Encounter: Payer: Self-pay | Admitting: *Deleted

## 2022-05-18 ENCOUNTER — Encounter: Payer: Self-pay | Admitting: Obstetrics & Gynecology

## 2022-05-18 ENCOUNTER — Other Ambulatory Visit (HOSPITAL_COMMUNITY)
Admission: RE | Admit: 2022-05-18 | Discharge: 2022-05-18 | Disposition: A | Discharge status: Home / Self Care | Payer: BC Managed Care – PPO | Source: Ambulatory Visit | Attending: Obstetrics & Gynecology | Admitting: Obstetrics & Gynecology

## 2022-05-18 ENCOUNTER — Ambulatory Visit (INDEPENDENT_AMBULATORY_CARE_PROVIDER_SITE_OTHER): Payer: BC Managed Care – PPO | Admitting: Obstetrics & Gynecology

## 2022-05-18 VITALS — BP 104/62 | HR 78 | Ht 64.25 in | Wt 123.0 lb

## 2022-05-18 DIAGNOSIS — Z789 Other specified health status: Secondary | ICD-10-CM

## 2022-05-18 DIAGNOSIS — Z01419 Encounter for gynecological examination (general) (routine) without abnormal findings: Secondary | ICD-10-CM | POA: Insufficient documentation

## 2022-05-18 NOTE — Progress Notes (Signed)
    Eriyanna Kofoed October 03, 1996 [id number]   History:    26 y.o. G0 Engaged, getting married in [place] 07/2022.   RP:  Established patient presenting for annual gyn exam    HPI: Menses regular normal except for recurring dysmenorrhea for which NSAIDs is currently helpful  Condoms for contraception.  No pain with IC.  Last Pap 05/2021 Negative. Pap reflex today. STI screen declined.  Breasts normal.  BMI 20.95.   Past medical history,surgical history, family history and social history were all reviewed and documented in the EPIC chart.  Gynecologic History Patient's last menstrual period was 05/04/2022 (exact date).  Obstetric History OB History  Gravida Para Term Preterm AB Living  0 0 0 0 0 0  SAB IAB Ectopic Multiple Live Births  0 0 0 0 0     ROS: A ROS was performed and pertinent positives and negatives are included in the history. GENERAL: No fevers or chills. HEENT: No change in vision, no earache, sore throat or sinus congestion. NECK: No pain or stiffness. CARDIOVASCULAR: No chest pain or pressure. No palpitations. PULMONARY: No shortness of breath, cough or wheeze. GASTROINTESTINAL: No abdominal pain, nausea, vomiting or diarrhea, melena or bright red blood per rectum. GENITOURINARY: No urinary frequency, urgency, hesitancy or dysuria. MUSCULOSKELETAL: No joint or muscle pain, no back pain, no recent trauma. DERMATOLOGIC: No rash, no itching, no lesions. ENDOCRINE: No polyuria, polydipsia, no heat or cold intolerance. No recent change in weight. HEMATOLOGICAL: No anemia or easy bruising or bleeding. NEUROLOGIC: No headache, seizures, numbness, tingling or weakness. PSYCHIATRIC: No depression, no loss of interest in normal activity or change in sleep pattern.     Exam:   BP 104/62   Pulse 78   Ht 5' 4.25" (1.632 m)   Wt 123 lb (55.8 kg)   LMP 05/04/2022 (Exact Date)   BMI 20.95 kg/m   Body mass index is 20.95 kg/m.  General appearance : Well developed well nourished  female. No acute distress HEENT: Eyes: no retinal hemorrhage or exudates,  Neck supple, trachea midline, no carotid bruits, no thyroidmegaly Lungs: Clear to auscultation, no rhonchi or wheezes, or rib retractions  Heart: Regular rate and rhythm, no murmurs or gallops Breast:Examined in sitting and supine position were symmetrical in appearance, no palpable masses or tenderness,  no skin retraction, no nipple inversion, no nipple discharge, no skin discoloration, no axillary or supraclavicular lymphadenopathy Abdomen: no palpable masses or tenderness, no rebound or guarding Extremities: no edema or skin discoloration or tenderness  Pelvic: Vulva: Normal             Vagina: No gross lesions or discharge  Cervix: No gross lesions or discharge.  Pap reflex done.  Uterus  AV, normal size, shape and consistency, non-tender and mobile  Adnexa  Without masses or tenderness  Anus: Normal   Assessment/Plan:  26 y.o. female for annual exam   1. Encounter for routine gynecological examination with Papanicolaou smear of cervix Menses regular normal except for recurring dysmenorrhea for which NSAIDs is currently helpful  Condoms for contraception.  No pain with IC.  Last Pap 05/2021 Negative. Pap reflex today. STI screen declined.  Breasts normal.  BMI 20.95. - Cytology - PAP( [place])  2. Use of condoms for contraception  Other orders - loratadine (CLARITIN) 10 MG tablet; Take 10 mg by mouth daily.   Genia Del MD, 10:09 AM 05/18/2022

## 2022-05-20 NOTE — Progress Notes (Signed)
05/21/2022 Shayann Garbutt [id number] 05-16-1996   CHIEF COMPLAINT: Irritable bowel, constipation and diarrhea  HISTORY OF PRESENT ILLNESS: Caitlin Velazquez is a 26 year old female with a past medical history of anxiety and reflux symptoms. Past wisdom teeth extraction. She presents today as referred by Dr. Neena Rhymes for further evaluation regarding constipation predominant irritable bowel syndrome.  She endorses having chronic constipation, she can go 2 to 3 days without passing a bowel movement then passes several small hard stools with gas.  The next day she passes a larger formed stool then a smaller stool for the next few days then the cycle repeats.  She has RLQ pain which she feels is due to gas build up.  RLQ pain improves after she passes gas or bowel movement.  Sometimes the RLQ pressure pain radiates down her leg.  Urine her menstrual cycle, she has nonbloody watery or mud like diarrhea 3 times daily for 2 to 3 days.  She infrequently sees a small amount of bright red blood on the toilet tissue after she passes a hard stool. She takes a probiotic daily.  Tries to drink at least 64 ounces of water daily.  She had reflux symptoms several years ago triggered by tomatoes and onions she maintained a vegan diet.  Since then, she avoids tomatoes and onions and is no longer eating a vegan diet and her reflux symptoms abated.  She was recently seen by her gynecologist and she reported a pelvic exam was normal.   Past Medical History:  Diagnosis Date   Allergy    ASCUS of cervix with negative high risk HPV    Seasonal allergies    Past Surgical History:  Procedure Laterality Date   WISDOM TOOTH EXTRACTION     Social History: She is single.  She is a Corporate investment banker.  Non-smoker.  No alcohol use.  No drug use.  Family History: Father with history of a type of blood cancer, rare. Mother with hypertension and heart disease. Maternal Great aunt had breast cancer.  No known family  history of esophageal, gastric or colorectal cancer.  Allergies  Allergen Reactions   Tea Itching    She can drink green tea      Outpatient Encounter Medications as of 05/21/2022  Medication Sig   Adapalene 0.3 % gel APPLY A SMALL AMOUNT TO SKIN EVERY OTHER DAY   dicyclomine (BENTYL) 20 MG tablet Take 1 tablet (20 mg total) by mouth 4 (four) times daily -  before meals and at bedtime.   loratadine (CLARITIN) 10 MG tablet Take 10 mg by mouth daily.   polyethylene glycol powder (GLYCOLAX/MIRALAX) 17 GM/SCOOP powder Take 17 g by mouth daily.   No facility-administered encounter medications on file as of 05/21/2022.    REVIEW OF SYSTEMS:  Gen: + Fatigue. Denies fever, sweats or chills. No weight loss.  CV: Denies chest pain, palpitations or edema. Resp: Denies cough, shortness of breath of hemoptysis.  GI: See HPI.  No GERD symptoms.   GU : Denies urinary burning, blood in urine, increased urinary frequency or incontinence. + Menstrual pain.  MS: Denies joint pain, muscles aches or weakness. Derm: Denies rash, itchiness, skin lesions or unhealing ulcers. Psych: Denies depression, anxiety or memory loss. Heme: Denies bruising, easy bleeding. Neuro:  Denies headaches, dizziness or paresthesias. Endo:  Denies any problems with DM, thyroid or adrenal function.  PHYSICAL EXAM: BP 122/70   Pulse 76   Ht 5\' 4"  (1.626 m)   Wt  123 lb 6.4 oz (56 kg)   LMP 05/04/2022 (Exact Date)   SpO2 99%   BMI 21.18 kg/m  General: 26 year old female in no acute distress. Head: Normocephalic and atraumatic. Eyes:  Sclerae non-icteric, conjunctive pink. Ears: Normal auditory acuity. Mouth: Dentition intact. No ulcers or lesions.  Neck: Supple, no lymphadenopathy or thyromegaly.  Lungs: Clear bilaterally to auscultation without wheezes, crackles or rhonchi. Heart: Regular rate and rhythm. No murmur, rub or gallop appreciated.  Abdomen: Soft, nondistended.  Mild tenderness to the RLQ and LLQ area, RLQ  > LLQ without rebound or guarding.  No masses. No hepatosplenomegaly. Normoactive bowel sounds x 4 quadrants.  Rectal: Deferred. Musculoskeletal: Symmetrical with no gross deformities. Skin: Warm and dry. No rash or lesions on visible extremities. Extremities: No edema. Neurological: Alert oriented x 4, no focal deficits.  Psychological:  Alert and cooperative. Normal mood and affect.  ASSESSMENT AND PLAN:  6) 26 year old female with chronic constipation -Benefiber 1 tablespoon daily if tolerated -MiraLAX nightly -Consider trial with Linzess if no improvement -Drink 64 ounces of water daily -TSH, TTG, IgA  2) Diarrhea, associated with menstrual cycles -Benefiber as tolerated -Take Dicyclomine twice daily during menstrual cycles -Patient to contact office if diarrhea worsens  3) Lower abdominal pain, RLQ > LLQ -Dicyclomine 20 mg 1 p.o. 3 times daily as needed -Follow-up with gynecologist to consider pelvic sonogram to evaluate ovaries  -CBC, CMP -Consider CTAP with contrast if symptoms persist or worsen  4) Infrequent bright red blood per the rectum, occurs after passing a hard stool -Avoid constipation. Benefiber, MiraLAX as noted above.  Consider future trial with Linzess. -I discussed scheduling a future diagnostic colonoscopy to rule out IBD, to discuss further with Dr. Adela Lank at the time of follow-up in 8 weeks    CC:  Beverely Low Helane Rima, MD

## 2022-05-21 ENCOUNTER — Ambulatory Visit (INDEPENDENT_AMBULATORY_CARE_PROVIDER_SITE_OTHER): Payer: BC Managed Care – PPO | Admitting: Nurse Practitioner

## 2022-05-21 ENCOUNTER — Encounter: Payer: Self-pay | Admitting: Nurse Practitioner

## 2022-05-21 ENCOUNTER — Other Ambulatory Visit (INDEPENDENT_AMBULATORY_CARE_PROVIDER_SITE_OTHER): Payer: BC Managed Care – PPO

## 2022-05-21 VITALS — BP 122/70 | HR 76 | Ht 64.0 in | Wt 123.4 lb

## 2022-05-21 DIAGNOSIS — R197 Diarrhea, unspecified: Secondary | ICD-10-CM

## 2022-05-21 DIAGNOSIS — R1032 Left lower quadrant pain: Secondary | ICD-10-CM

## 2022-05-21 DIAGNOSIS — K5900 Constipation, unspecified: Secondary | ICD-10-CM | POA: Diagnosis not present

## 2022-05-21 DIAGNOSIS — R1031 Right lower quadrant pain: Secondary | ICD-10-CM

## 2022-05-21 DIAGNOSIS — R1030 Lower abdominal pain, unspecified: Secondary | ICD-10-CM

## 2022-05-21 LAB — CBC WITH DIFFERENTIAL/PLATELET
Basophils Absolute: 0 10*3/uL (ref 0.0–0.1)
Basophils Relative: 0.3 % (ref 0.0–3.0)
Eosinophils Absolute: 0.1 10*3/uL (ref 0.0–0.7)
Eosinophils Relative: 1.3 % (ref 0.0–5.0)
HCT: 36.2 % (ref 36.0–46.0)
Hemoglobin: 11.9 g/dL — ABNORMAL LOW (ref 12.0–15.0)
Lymphocytes Relative: 24.9 % (ref 12.0–46.0)
Lymphs Abs: 1.9 10*3/uL (ref 0.7–4.0)
MCHC: 32.9 g/dL (ref 30.0–36.0)
MCV: 90 fl (ref 78.0–100.0)
Monocytes Absolute: 0.6 10*3/uL (ref 0.1–1.0)
Monocytes Relative: 7.3 % (ref 3.0–12.0)
Neutro Abs: 5.1 10*3/uL (ref 1.4–7.7)
Neutrophils Relative %: 66.2 % (ref 43.0–77.0)
Platelets: 259 10*3/uL (ref 150.0–400.0)
RBC: 4.02 Mil/uL (ref 3.87–5.11)
RDW: 13.7 % (ref 11.5–15.5)
WBC: 7.7 10*3/uL (ref 4.0–10.5)

## 2022-05-21 LAB — COMPREHENSIVE METABOLIC PANEL
ALT: 14 U/L (ref 0–35)
AST: 15 U/L (ref 0–37)
Albumin: 4.3 g/dL (ref 3.5–5.2)
Alkaline Phosphatase: 55 U/L (ref 39–117)
BUN: 13 mg/dL (ref 6–23)
CO2: 25 mEq/L (ref 19–32)
Calcium: 9.4 mg/dL (ref 8.4–10.5)
Chloride: 105 mEq/L (ref 96–112)
Creatinine, Ser: 0.83 mg/dL (ref 0.40–1.20)
GFR: 97.72 mL/min (ref >60.00)
Glucose, Bld: 81 mg/dL (ref 70–99)
Potassium: 4 mEq/L (ref 3.5–5.1)
Sodium: 138 mEq/L (ref 135–145)
Total Bilirubin: 0.4 mg/dL (ref 0.2–1.2)
Total Protein: 7.8 g/dL (ref 6.0–8.3)

## 2022-05-21 LAB — TSH: TSH: 1.37 u[IU]/mL (ref 0.35–5.50)

## 2022-05-21 NOTE — Progress Notes (Signed)
Agree with assessment and plan as outlined.  

## 2022-05-21 NOTE — Patient Instructions (Addendum)
If you are age 26 or older, your body mass index should be between 23-30. Your Body mass index is 21.18 kg/m. If this is out of the aforementioned range listed, please consider follow up with your Primary Care Provider.  If you are age 36 or younger, your body mass index should be between 19-25. Your Body mass index is 21.18 kg/m. If this is out of the aformentioned range listed, please consider follow up with your Primary Care Provider.   ________________________________________________________  The [place] [facility] providers would like to encourage you to use [facility] to communicate with providers for non-urgent requests or questions.  Due to long hold times on the telephone, sending your provider a message by [facility] may be a faster and more efficient way to get a response.  Please allow 48 business hours for a response.  Please remember that this is for non-urgent requests.  _______________________________________________________   Your provider has requested that you go to the basement level for lab work before leaving today. Press "B" on the elevator. The lab is located at the first door on the left as you exit the elevator.   Medication Samples have been provided to the patient:  IBGARD  take 2 capsules up to three times a day.   Start Benefiber over the counter fiber, 1 tablespoon daily.  Start Miralax 1 capful in the evenings.  Continue Dicyclomine   Follow up with Dr Adela Lank   Please follow up with your OBGYN, consider a pelvic ultrasound.   Due to recent changes in healthcare laws, you may see the results of your imaging and laboratory studies on MyChart before your provider has had a chance to review them.  We understand that in some cases there may be results that are confusing or concerning to you. Not all laboratory results come back in the same time frame and the provider may be waiting for multiple results in order to interpret others.  Please give [place] 48 hours in order for your  provider to thoroughly review all the results before contacting the office for clarification of your results.   It was a pleasure to see you today!  Thank you for trusting me with your gastrointestinal care!

## 2022-05-22 LAB — TISSUE TRANSGLUTAMINASE, IGA: (tTG) Ab, IgA: <1 U/mL

## 2022-05-23 ENCOUNTER — Other Ambulatory Visit: Payer: Self-pay

## 2022-05-23 DIAGNOSIS — D649 Anemia, unspecified: Secondary | ICD-10-CM

## 2022-05-23 LAB — IGA: Immunoglobulin A: 167 mg/dL (ref 47–310)

## 2022-05-25 LAB — CYTOLOGY - PAP
Comment: NEGATIVE
Diagnosis: UNDETERMINED — AB
High risk HPV: NEGATIVE

## 2022-07-27 ENCOUNTER — Ambulatory Visit (INDEPENDENT_AMBULATORY_CARE_PROVIDER_SITE_OTHER): Payer: BC Managed Care – PPO | Admitting: Gastroenterology

## 2022-07-27 ENCOUNTER — Encounter: Payer: Self-pay | Admitting: Gastroenterology

## 2022-07-27 VITALS — BP 120/60 | HR 62 | Ht 64.0 in | Wt 124.8 lb

## 2022-07-27 DIAGNOSIS — K5900 Constipation, unspecified: Secondary | ICD-10-CM | POA: Diagnosis not present

## 2022-07-27 DIAGNOSIS — K219 Gastro-esophageal reflux disease without esophagitis: Secondary | ICD-10-CM | POA: Diagnosis not present

## 2022-07-27 DIAGNOSIS — R140 Abdominal distension (gaseous): Secondary | ICD-10-CM

## 2022-07-27 DIAGNOSIS — R1031 Right lower quadrant pain: Secondary | ICD-10-CM | POA: Diagnosis not present

## 2022-07-27 MED ORDER — FAMOTIDINE 20 MG PO TABS: 20.0000 mg | ORAL_TABLET | ORAL | Status: DC | PRN

## 2022-07-27 MED ORDER — IBGARD 90 MG PO CPCR: ORAL_CAPSULE | ORAL | 0 refills | Status: DC

## 2022-07-27 NOTE — Patient Instructions (Signed)
_______________________________________________________  If you are age 26 or older, your body mass index should be between 23-30. Your Body mass index is 21.42 kg/m. If this is out of the aforementioned range listed, please consider follow up with your Primary Care Provider.  If you are age 55 or younger, your body mass index should be between 19-25. Your Body mass index is 21.42 kg/m. If this is out of the aformentioned range listed, please consider follow up with your Primary Care Provider.   ________________________________________________________  The [facility] providers would like to encourage you to use [facility] to communicate with providers for non-urgent requests or questions.  Due to long hold times on the telephone, sending your provider a message by [facility] may be a faster and more efficient way to get a response.  Please allow 48 business hours for a response.  Please remember that this is for non-urgent requests.  _______________________________________________________  Please go to the lab in the basement of our building to have lab work done as you leave today or at your convenience. Hit "B" for basement when you get on the elevator.  When the doors open the lab is on your left.  Our lab is located in the basement of our building, located at [address]. They are open Monday through Friday from 7:30 am to 5:00 pm. You do not need an appointment. We will call you with the results. Thank you.  We are giving you a Low-FODMAP diet handout today. FODMAPs are short-chain carbohydrates (sugars) that are highly fermentable, which means that they go through chemical changes in the GI system, and are poorly absorbed during digestion. When FODMAPs reach the colon (large intestine), bacteria ferment these sugars, turning them into gas and chemicals. This stretches the walls of the colon, causing abdominal bloating, distension, cramping, pain, and/or changes in bowel habits in many patients  with IBS. FODMAPs are not unhealthy or harmful, but may exacerbate GI symptoms in those with sensitive GI tracts.   __________________________________________________________  Caitlin Velazquez have been scheduled for a CT scan of the abdomen and pelvis at [facility], 1st floor Radiology. You are scheduled on Friday, 08-03-22 at 8:30 am. You should arrive 15 minutes prior to your appointment time for registration.   Please follow the written instructions below on the day of your exam:   You may take any medications as prescribed with a small amount of water, if necessary. If you take any of the following medications: METFORMIN, GLUCOPHAGE, [place], AVANDAMET, RIOMET, FORTAMET, [place] MET, JANUMET, GLUMETZA or METAGLIP, you MAY be asked to HOLD this medication 48 hours AFTER the exam.   Plan on being at [facility] for 30-45 minutes or longer, depending on the type of exam you are having performed.   If you have any questions regarding your exam or if you need to reschedule, you may call [facility] at [phone] between the hours of 8:00 am and 5:00 pm, Monday-Friday.  ______________________________________________________________  Please purchase the following medications over the counter and take as directed:  Miralax  Discontinue Bentyl  We have given you samples of the following medication to take: IBgard - use as directed  Continue Pepcid as needed  Thank you for entrusting me with your care and for choosing [employer], Dr. [place] Cellar

## 2022-07-27 NOTE — Progress Notes (Signed)
HPI :  26 year old female here for history of constipation, right lower quadrant plain, increased gas, history of rectal bleeding, reflux.  She was seen by Carl Best this past August.  See that note for details.  She reports having constipation mostly her entire life, has been worse over the past year.  She would typically have a bowel movement every 2 to 3 days if she does not take anything, passes hard stool.  Occasionally if passing hard stool she would have a small amount of blood on the toilet paper when wiping herself associated with some rectal pain.  She is also had some intermittent right lower quadrant discomfort that can come and go, really bothers her when she gets it and feels like there is a "gas bubble" in that area when she has pain.  At the time of the office visit it was recommended that she take MiraLAX and Benefiber daily.  She was given some dicyclomine to use as needed for pain, had some basic labs done which showed normal LFTs and renal function, tested negative for celiac disease.  CBC showed hemoglobin 11.9 with normal MCV and was told to repeat in a few months.  She states the MiraLAX and Benefiber definitely helped.  Due to her work schedule and lifestyle she has a hard time taking it daily, in fact takes it perhaps once weekly, however she states with doing that her bowels definitely seem better.  She is not having any hard stools anymore and is not straining.  She has not seen any blood for the past 6 months, that has resolved.  She is actually having bowel movements on a mostly daily basis.  Overall doing much better in regards to her bowels and bleeding symptoms.  The main thing that continues to bother her is right lower quadrant discomfort.  She continues to get this periodically and it really bothers her when it happens.  It is hard to say if having a bowel movement or passing gas actually helps this, if she passes a lot of gas she will find some relief.   She questions if it is related to her diet, she is been eating a lot of fast food lately.  She does think there are some dietary relation to this symptom.  She endorses a history of reflux in the past.  She is very sensitive to tomato-based foods and onions.  She avoids those and typically symptoms do not really bother her.  She was on omeprazole in the past but has not taken it in over a year.  She denies any weight loss, night sweats etc.  Otherwise feeling well.  She states Bentyl made her feel drowsy and did not like taking it.  She was also tried on some IBgard samples and states that has helped some of her pain although not all the time.  She is not taking that routinely.  No family history of colon cancer or IBD.   Past Medical History:  Diagnosis Date   Allergy    ASCUS of cervix with negative high risk HPV    Constipation    Seasonal allergies      Past Surgical History:  Procedure Laterality Date   WISDOM TOOTH EXTRACTION     Family History  Problem Relation Age of Onset   Hypertension Mother    Heart disease Mother    Cancer Father        blood   Asthma Sister    Colon cancer Neg  Hx    Stomach cancer Neg Hx    Esophageal cancer Neg Hx    Colon polyps Neg Hx    Social History   Tobacco Use   Smoking status: Never   Smokeless tobacco: Never  Vaping Use   Vaping Use: Never used  Substance Use Topics   Alcohol use: Yes    Comment: 2-3 x a month    Drug use: Never   Current Outpatient Medications  Medication Sig Dispense Refill   Adapalene 0.3 % gel APPLY A SMALL AMOUNT TO SKIN EVERY OTHER DAY     loratadine (CLARITIN) 10 MG tablet Take 10 mg by mouth daily.     polyethylene glycol powder (GLYCOLAX/MIRALAX) 17 GM/SCOOP powder Take 17 g by mouth daily. 850 g 3   No current facility-administered medications for this visit.   Allergies  Allergen Reactions   Tea Itching    She can drink green tea     Review of Systems: All systems reviewed and negative  except where noted in HPI.   Lab Results  Component Value Date   WBC 7.7 05/21/2022   HGB 11.9 (L) 05/21/2022   HCT 36.2 05/21/2022   MCV 90.0 05/21/2022   PLT 259.0 05/21/2022    Lab Results  Component Value Date   CREATININE 0.83 05/21/2022   BUN 13 05/21/2022   NA 138 05/21/2022   K 4.0 05/21/2022   CL 105 05/21/2022   CO2 25 05/21/2022   Lab Results  Component Value Date   ALT 14 05/21/2022   AST 15 05/21/2022   ALKPHOS 55 05/21/2022   BILITOT 0.4 05/21/2022     Physical Exam: BP 120/60 (BP Location: Left Arm, Patient Position: Sitting, Cuff Size: Normal)   Pulse 62   Ht 5\' 4"  (1.626 m)   Wt 124 lb 12.8 oz (56.6 kg)   SpO2 99%   BMI 21.42 kg/m  Constitutional: Pleasant,well-developed, female in no acute distress. Abdominal: Soft, nondistended, nontender. there are no masses palpable. Rectal exam - declined Neurological: Alert and oriented to person place and time. Skin: Skin is warm and dry. No rashes noted. Psychiatric: Normal mood and affect. Behavior is normal.   ASSESSMENT: 26 y.o. female here for assessment of the following  1. RLQ abdominal pain   2. Constipation, unspecified constipation type   3. Bloating   4. Gastroesophageal reflux disease, unspecified whether esophagitis present    As above, history of chronic constipation with gas bloating and intermittent right lower quadrant pain.  Previously was having scant rectal bleeding in the setting of passing very hard stool.  That has since resolved with improvement of her bowel habits on MiraLAX and Benefiber.  Offered her DRE today to make sure no concerning process there, she declines as her bleeding symptoms have resolved.  She is doing better in regards to her constipation and bleeding on her current regimen.  I do think she has room for improvement with taking MiraLAX and Benefiber more frequently, she will work on this.  Continues to have gas and bloating that bothers her.  She will try to work  on diet and limit fast food, provided her a handout for low FODMAP diet we will see if that helps as well.  She does have some drowsiness with Bentyl, does not like how she feels on it, she can stop that.  She can use IBgard as that has helped her more and does not cause drowsiness.  Some samples given to her today and she  can take that daily if needed.  Her exam is benign.  We had recommended a follow-up CBC with iron studies to make sure no iron deficiency, she will get that done but wants to wait a few more weeks as she is on her menstrual cycle right now.  I think her pain is likely due to bowel spasm but given her persistent symptoms despite therapy so far and that this is her major complaint that still bothers her, offered her further evaluation for this with imaging.  She wants to proceed with a CT scan to make sure no ileitis or ovarian pathology that could be causing this.  She agrees with the plan as outlined.  Further recommendations pending results and her course.  Regarding her reflux, currently managed with diet which is fine.  If she needs to take something over-the-counter for breakthrough she can use Pepcid as needed.  PLAN: - CT scan abdomen / pelvis with contrast - continue Miralax / Benefiber - use more frequently than currently done (weekly) - continue IB gard - samples provided, can get OTC - stop bentyl - intolerance - trial of low FODMAP diet - reduce amount of fast food - pepcid PRN for GERD - CT abdomen/ pelvis with contrast for persistent RLQ pain - lab for CBC / TIBC ferritin to make sure stable  I spent 35 minutes of time, including in depth chart review, face-to-face time with the patient, and documentation of the encounter.   Jolly Mango, MD [facility]

## 2022-08-03 ENCOUNTER — Ambulatory Visit (HOSPITAL_COMMUNITY): Payer: BC Managed Care – PPO

## 2022-08-14 ENCOUNTER — Ambulatory Visit (HOSPITAL_COMMUNITY): Payer: BC Managed Care – PPO

## 2022-12-14 DIAGNOSIS — M79675 Pain in left toe(s): Secondary | ICD-10-CM | POA: Diagnosis not present

## 2023-05-27 ENCOUNTER — Encounter: Payer: BC Managed Care – PPO | Admitting: Family Medicine

## 2023-08-02 ENCOUNTER — Ambulatory Visit: Payer: BC Managed Care – PPO | Admitting: Obstetrics and Gynecology

## 2023-08-02 ENCOUNTER — Encounter: Payer: BC Managed Care – PPO | Admitting: Family Medicine

## 2023-08-28 ENCOUNTER — Ambulatory Visit: Payer: BC Managed Care – PPO | Admitting: Family Medicine

## 2023-08-28 ENCOUNTER — Encounter: Payer: Self-pay | Admitting: Family Medicine

## 2023-08-28 VITALS — BP 92/58 | HR 78 | Temp 97.8°F | Ht 63.5 in | Wt 119.1 lb

## 2023-08-28 DIAGNOSIS — Z0000 Encounter for general adult medical examination without abnormal findings: Secondary | ICD-10-CM

## 2023-08-28 DIAGNOSIS — R634 Abnormal weight loss: Secondary | ICD-10-CM | POA: Diagnosis not present

## 2023-08-28 LAB — CBC WITH DIFFERENTIAL/PLATELET
Basophils Absolute: 0 10*3/uL (ref 0.0–0.1)
Basophils Relative: 0.3 % (ref 0.0–3.0)
Eosinophils Absolute: 0.1 10*3/uL (ref 0.0–0.7)
Eosinophils Relative: 1.6 % (ref 0.0–5.0)
HCT: 36.6 % (ref 36.0–46.0)
Hemoglobin: 11.8 g/dL — ABNORMAL LOW (ref 12.0–15.0)
Lymphocytes Relative: 39.7 % (ref 12.0–46.0)
Lymphs Abs: 2.3 10*3/uL (ref 0.7–4.0)
MCHC: 32.3 g/dL (ref 30.0–36.0)
MCV: 90 fL (ref 78.0–100.0)
Monocytes Absolute: 0.4 10*3/uL (ref 0.1–1.0)
Monocytes Relative: 7.3 % (ref 3.0–12.0)
Neutro Abs: 2.9 10*3/uL (ref 1.4–7.7)
Neutrophils Relative %: 51.1 % (ref 43.0–77.0)
Platelets: 334 10*3/uL (ref 150.0–400.0)
RBC: 4.06 Mil/uL (ref 3.87–5.11)
RDW: 14 % (ref 11.5–15.5)
WBC: 5.7 10*3/uL (ref 4.0–10.5)

## 2023-08-28 LAB — HEPATIC FUNCTION PANEL
ALT: 11 U/L (ref 0–35)
AST: 18 U/L (ref 0–37)
Albumin: 4.3 g/dL (ref 3.5–5.2)
Alkaline Phosphatase: 58 U/L (ref 39–117)
Bilirubin, Direct: 0.1 mg/dL (ref 0.0–0.3)
Total Bilirubin: 0.5 mg/dL (ref 0.2–1.2)
Total Protein: 7.8 g/dL (ref 6.0–8.3)

## 2023-08-28 LAB — LIPID PANEL
Cholesterol: 173 mg/dL (ref 0–200)
HDL: 58.4 mg/dL (ref >39.00)
LDL Cholesterol: 102 mg/dL — ABNORMAL HIGH (ref 0–99)
NonHDL: 114.25
Total CHOL/HDL Ratio: 3
Triglycerides: 61 mg/dL (ref 0.0–149.0)
VLDL: 12.2 mg/dL (ref 0.0–40.0)

## 2023-08-28 LAB — BASIC METABOLIC PANEL
BUN: 9 mg/dL (ref 6–23)
CO2: 26 meq/L (ref 19–32)
Calcium: 9.7 mg/dL (ref 8.4–10.5)
Chloride: 104 meq/L (ref 96–112)
Creatinine, Ser: 0.71 mg/dL (ref 0.40–1.20)
GFR: 116.82 mL/min (ref >60.00)
Glucose, Bld: 86 mg/dL (ref 70–99)
Potassium: 4 meq/L (ref 3.5–5.1)
Sodium: 136 meq/L (ref 135–145)

## 2023-08-28 LAB — TSH: TSH: 1.05 u[IU]/mL (ref 0.35–5.50)

## 2023-08-28 LAB — VITAMIN D 25 HYDROXY (VIT D DEFICIENCY, FRACTURES): VITD: 11.78 ng/mL — ABNORMAL LOW (ref 30.00–100.00)

## 2023-08-28 NOTE — Progress Notes (Signed)
   Subjective:    Patient ID: Caitlin Velazquez, female    DOB: [birth year], 27 y.o.   MRN: [id number]  HPI CPE- UTD on pap, Tdap.  Patient Care Team    Relationship Specialty Notifications Start End  Sheliah Hatch, MD PCP - General Family Medicine  08/12/19   Genia Del, MD Consulting Physician Obstetrics and Gynecology  12/27/20     Health Maintenance  Topic Date Due   HIV Screening  Never done   Hepatitis C Screening  Never done   HPV VACCINES (2 - 3-dose series) 05/19/2015   INFLUENZA VACCINE  05/09/2023   COVID-19 Vaccine (2 - 2023-24 season) 06/09/2023   Cervical Cancer Screening (Pap smear)  05/18/2025   DTaP/Tdap/Td (8 - Td or Tdap) 08/11/2029      Review of Systems Patient reports no vision/ hearing changes, adenopathy,fever,  persistant/recurrent hoarseness , swallowing issues, chest pain, palpitations, edema, persistant/recurrent cough, hemoptysis, dyspnea (rest/exertional/paroxysmal nocturnal), gastrointestinal bleeding (melena, rectal bleeding), abdominal pain, significant heartburn, bowel changes, GU symptoms (dysuria, hematuria, incontinence), Gyn symptoms (abnormal  bleeding, pain),  syncope, focal weakness, memory loss, numbness & tingling, skin/hair/nail changes, abnormal bruising or bleeding, anxiety, or depression.   + 6 lb weight loss.  Not trying to lose weight but does intermittent fasting.    Objective:   Physical Exam General Appearance:    Alert, cooperative, no distress, appears stated age  Head:    Normocephalic, without obvious abnormality, atraumatic  Eyes:    PERRL, conjunctiva/corneas clear, EOM's intact both eyes  Ears:    Normal TM's and external ear canals, both ears  Nose:   Nares normal, septum midline, mucosa normal, no drainage    or sinus tenderness  Throat:   Lips, mucosa, and tongue normal; teeth and gums normal  Neck:   Supple, symmetrical, trachea midline, no adenopathy;    Thyroid: no enlargement/tenderness/nodules  Back:      Symmetric, no curvature, ROM normal, no CVA tenderness  Lungs:     Clear to auscultation bilaterally, respirations unlabored  Chest Wall:    No tenderness or deformity   Heart:    Regular rate and rhythm, S1 and S2 normal, no murmur, rub   or gallop  Breast Exam:    Deferred to GYN  Abdomen:     Soft, non-tender, bowel sounds active all four quadrants,    no masses, no organomegaly  Genitalia:    Deferred to GYN  Rectal:    Extremities:   Extremities normal, atraumatic, no cyanosis or edema  Pulses:   2+ and symmetric all extremities  Skin:   Skin color, texture, turgor normal, no rashes or lesions  Lymph nodes:   Cervical, supraclavicular, and axillary nodes normal  Neurologic:   CNII-XII intact, normal strength, sensation and reflexes    throughout          Assessment & Plan:

## 2023-08-28 NOTE — Assessment & Plan Note (Signed)
Pt's PE WNL.  UTD on Tdap, pap.  Check labs.  Anticipatory guidance provided.

## 2023-08-28 NOTE — Patient Instructions (Signed)
Follow up in 1 year or as needed We'll notify you of your lab results and make any changes if needed Keep up the good work!  You look great! Call with any questions or concerns Stay Safe!  Stay Healthy! Happy Holidays!!! 

## 2023-08-29 ENCOUNTER — Other Ambulatory Visit: Payer: Self-pay

## 2023-08-29 ENCOUNTER — Telehealth: Payer: Self-pay

## 2023-08-29 MED ORDER — VITAMIN D (ERGOCALCIFEROL) 1.25 MG (50000 UNIT) PO CAPS: 50000.0000 [IU] | ORAL_CAPSULE | ORAL | 0 refills | Status: DC

## 2023-08-29 NOTE — Telephone Encounter (Signed)
-----   Message from Neena Rhymes sent at 08/29/2023  7:32 AM EST ----- Labs look great w/ exception of low Vit D.  Based on this, we need to start 50,000 units weekly x12 weeks in addition to daily OTC supplement of at least 2000 units.

## 2023-08-29 NOTE — Telephone Encounter (Signed)
Pt has been notified Vitamin D has been sent to prefer pharmacy

## 2023-10-11 DIAGNOSIS — M67432 Ganglion, left wrist: Secondary | ICD-10-CM | POA: Diagnosis not present

## 2023-10-11 DIAGNOSIS — M6740 Ganglion, unspecified site: Secondary | ICD-10-CM | POA: Diagnosis not present

## 2024-02-18 ENCOUNTER — Encounter: Payer: Self-pay | Admitting: Family Medicine

## 2024-02-18 NOTE — Telephone Encounter (Signed)
 Patient is leaving for vacation in August and is in need of motion sickness medication, Physical is scheduled for November do you need to see patient for this medication?

## 2024-04-20 ENCOUNTER — Other Ambulatory Visit: Payer: Self-pay | Admitting: Family

## 2024-04-20 ENCOUNTER — Encounter: Payer: Self-pay | Admitting: Family Medicine

## 2024-04-20 MED ORDER — SCOPOLAMINE 1 MG/3DAYS TD PT72: 1.0000 | MEDICATED_PATCH | TRANSDERMAL | 0 refills | Status: DC

## 2024-04-20 NOTE — Telephone Encounter (Signed)
 Dr Mahlon had told this patient to write back closer to her trip for her motion sickness medication can you please review and send?

## 2024-04-24 DIAGNOSIS — M79662 Pain in left lower leg: Secondary | ICD-10-CM | POA: Diagnosis not present

## 2024-04-24 DIAGNOSIS — S8012XA Contusion of left lower leg, initial encounter: Secondary | ICD-10-CM | POA: Diagnosis not present

## 2024-05-18 DIAGNOSIS — R112 Nausea with vomiting, unspecified: Secondary | ICD-10-CM | POA: Diagnosis not present

## 2024-05-18 DIAGNOSIS — U071 COVID-19: Secondary | ICD-10-CM | POA: Diagnosis not present

## 2024-08-04 DIAGNOSIS — Z23 Encounter for immunization: Secondary | ICD-10-CM | POA: Diagnosis not present

## 2024-08-28 ENCOUNTER — Ambulatory Visit: Payer: BC Managed Care – PPO | Admitting: Family Medicine

## 2024-08-28 ENCOUNTER — Encounter: Payer: Self-pay | Admitting: Family Medicine

## 2024-08-28 VITALS — BP 106/62 | HR 76 | Temp 98.6°F | Resp 16 | Ht 63.5 in | Wt 125.0 lb

## 2024-08-28 DIAGNOSIS — Z1159 Encounter for screening for other viral diseases: Secondary | ICD-10-CM

## 2024-08-28 DIAGNOSIS — E7800 Pure hypercholesterolemia, unspecified: Secondary | ICD-10-CM

## 2024-08-28 DIAGNOSIS — H9312 Tinnitus, left ear: Secondary | ICD-10-CM

## 2024-08-28 DIAGNOSIS — Z0000 Encounter for general adult medical examination without abnormal findings: Secondary | ICD-10-CM

## 2024-08-28 DIAGNOSIS — R42 Dizziness and giddiness: Secondary | ICD-10-CM

## 2024-08-28 DIAGNOSIS — E559 Vitamin D deficiency, unspecified: Secondary | ICD-10-CM

## 2024-08-28 DIAGNOSIS — Z114 Encounter for screening for human immunodeficiency virus [HIV]: Secondary | ICD-10-CM

## 2024-08-28 LAB — HEPATIC FUNCTION PANEL
ALT: 12 U/L (ref 0–35)
AST: 13 U/L (ref 0–37)
Albumin: 4.3 g/dL (ref 3.5–5.2)
Alkaline Phosphatase: 55 U/L (ref 39–117)
Bilirubin, Direct: 0.1 mg/dL (ref 0.0–0.3)
Total Bilirubin: 0.5 mg/dL (ref 0.2–1.2)
Total Protein: 8.1 g/dL (ref 6.0–8.3)

## 2024-08-28 LAB — BASIC METABOLIC PANEL WITH GFR
BUN: 11 mg/dL (ref 6–23)
CO2: 27 meq/L (ref 19–32)
Calcium: 9.4 mg/dL (ref 8.4–10.5)
Chloride: 103 meq/L (ref 96–112)
Creatinine, Ser: 0.75 mg/dL (ref 0.40–1.20)
GFR: 108.61 mL/min (ref >60.00)
Glucose, Bld: 73 mg/dL (ref 70–99)
Potassium: 4 meq/L (ref 3.5–5.1)
Sodium: 137 meq/L (ref 135–145)

## 2024-08-28 LAB — CBC WITH DIFFERENTIAL/PLATELET
Basophils Absolute: 0 K/uL (ref 0.0–0.1)
Basophils Relative: 0.2 % (ref 0.0–3.0)
Eosinophils Absolute: 0.1 K/uL (ref 0.0–0.7)
Eosinophils Relative: 0.9 % (ref 0.0–5.0)
HCT: 35.3 % — ABNORMAL LOW (ref 36.0–46.0)
Hemoglobin: 11.5 g/dL — ABNORMAL LOW (ref 12.0–15.0)
Lymphocytes Relative: 20.9 % (ref 12.0–46.0)
Lymphs Abs: 2.2 K/uL (ref 0.7–4.0)
MCHC: 32.6 g/dL (ref 30.0–36.0)
MCV: 85.7 fl (ref 78.0–100.0)
Monocytes Absolute: 0.7 K/uL (ref 0.1–1.0)
Monocytes Relative: 6.9 % (ref 3.0–12.0)
Neutro Abs: 7.6 K/uL (ref 1.4–7.7)
Neutrophils Relative %: 71.1 % (ref 43.0–77.0)
Platelets: 313 K/uL (ref 150.0–400.0)
RBC: 4.12 Mil/uL (ref 3.87–5.11)
RDW: 14.7 % (ref 11.5–15.5)
WBC: 10.7 K/uL — ABNORMAL HIGH (ref 4.0–10.5)

## 2024-08-28 LAB — VITAMIN D 25 HYDROXY (VIT D DEFICIENCY, FRACTURES): VITD: 21.63 ng/mL — ABNORMAL LOW (ref 30.00–100.00)

## 2024-08-28 LAB — TSH: TSH: 1.18 u[IU]/mL (ref 0.35–5.50)

## 2024-08-28 LAB — LIPID PANEL
Cholesterol: 163 mg/dL (ref 0–200)
HDL: 65 mg/dL (ref >39.00)
LDL Cholesterol: 85 mg/dL (ref 0–99)
NonHDL: 98.24
Total CHOL/HDL Ratio: 3
Triglycerides: 66 mg/dL (ref 0.0–149.0)
VLDL: 13.2 mg/dL (ref 0.0–40.0)

## 2024-08-28 MED ORDER — MECLIZINE HCL 25 MG PO TABS: 25.0000 mg | ORAL_TABLET | Freq: Three times a day (TID) | ORAL | 0 refills | Status: AC | PRN

## 2024-08-28 NOTE — Progress Notes (Unsigned)
   Subjective:    Patient ID: Caitlin Velazquez, female    DOB: 1995/10/13, 28 y.o.   MRN: [id number]  HPI CPE- UTD on pap, declines flu.  Patient Care Team    Relationship Specialty Notifications Start End  Mahlon Comer BRAVO, MD PCP - General Family Medicine  08/12/19     Health Maintenance  Topic Date Due   HIV Screening  Never done   Hepatitis C Screening  Never done   HPV VACCINES (2 - 3-dose series) 05/19/2015   Influenza Vaccine  01/05/2025 (Originally 05/08/2024)   Cervical Cancer Screening (Pap smear)  05/18/2025   DTaP/Tdap/Td (8 - Td or Tdap) 08/11/2029   Hepatitis B Vaccines 19-59 Average Risk  Completed   COVID-19 Vaccine  Completed   Pneumococcal Vaccine  Aged Out   Meningococcal B Vaccine  Aged Out      Review of Systems Patient reports no vision changes, adenopathy,fever, weight change,  persistant/recurrent hoarseness , swallowing issues, chest pain, palpitations, edema, persistant/recurrent cough, hemoptysis, dyspnea (rest/exertional/paroxysmal nocturnal), gastrointestinal bleeding (melena, rectal bleeding), abdominal pain, significant heartburn, bowel changes, GU symptoms (dysuria, hematuria, incontinence), Gyn symptoms (abnormal  bleeding, pain),  syncope, focal weakness, memory loss, numbness & tingling, skin/hair/nail changes, abnormal bruising or bleeding.   + ringing in L ear + dizziness    Objective:   Physical Exam General Appearance:    Alert, cooperative, no distress, appears stated age  Head:    Normocephalic, without obvious abnormality, atraumatic  Eyes:    PERRL, conjunctiva/corneas clear, EOM's intact both eyes  Ears:    Normal TM's and external ear canals, both ears  Nose:   Nares normal, septum midline, mucosa normal, no drainage    or sinus tenderness  Throat:   Lips, mucosa, and tongue normal; teeth and gums normal  Neck:   Supple, symmetrical, trachea midline, no adenopathy;    Thyroid : no enlargement/tenderness/nodules  Back:      Symmetric, no curvature, ROM normal, no CVA tenderness  Lungs:     Clear to auscultation bilaterally, respirations unlabored  Chest Wall:    No tenderness or deformity   Heart:    Regular rate and rhythm, S1 and S2 normal, no murmur, rub   or gallop  Breast Exam:    Deferred to GYN  Abdomen:     Soft, non-tender, bowel sounds active all four quadrants,    no masses, no organomegaly  Genitalia:    Deferred to GYN  Rectal:    Extremities:   Extremities normal, atraumatic, no cyanosis or edema  Pulses:   2+ and symmetric all extremities  Skin:   Skin color, texture, turgor normal, no rashes or lesions  Lymph nodes:   Cervical, supraclavicular, and axillary nodes normal  Neurologic:   CNII-XII intact, normal strength, sensation and reflexes    throughout          Assessment & Plan:

## 2024-08-28 NOTE — Patient Instructions (Signed)
 Follow up in 1 year or as needed We'll notify you of your lab results and make any changes if needed Keep up the good work!  You look great! Use the Meclizine  as needed for dizziness We'll call you to schedule your ENT appt Call with any questions or concerns Stay Safe!  Stay Healthy! Happy Holidays!!

## 2024-08-29 LAB — HEPATITIS C ANTIBODY: Hepatitis C Ab: NONREACTIVE

## 2024-08-29 LAB — HIV ANTIBODY (ROUTINE TESTING W REFLEX)
HIV 1&2 Ab, 4th Generation: NONREACTIVE
HIV FINAL INTERPRETATION: NEGATIVE

## 2024-08-30 NOTE — Assessment & Plan Note (Signed)
 Pt's PE WNL.  UTD on pap, Tdap.  Declines flu.  Based on dizziness and ringing in her L ear, will refer to ENT for evaluation.  Check labs.  Anticipatory guidance provided.

## 2024-08-31 ENCOUNTER — Ambulatory Visit: Payer: Self-pay | Admitting: Family Medicine

## 2024-08-31 MED ORDER — VITAMIN D (ERGOCALCIFEROL) 1.25 MG (50000 UNIT) PO CAPS
50000.0000 [IU] | ORAL_CAPSULE | ORAL | 0 refills | Status: AC
Start: 2024-08-31 — End: ?

## 2024-09-24 ENCOUNTER — Encounter (INDEPENDENT_AMBULATORY_CARE_PROVIDER_SITE_OTHER): Payer: Self-pay | Admitting: Physician Assistant

## 2024-09-24 ENCOUNTER — Ambulatory Visit (INDEPENDENT_AMBULATORY_CARE_PROVIDER_SITE_OTHER): Admitting: Physician Assistant

## 2024-09-24 VITALS — BP 96/65 | HR 84 | Temp 98.3°F | Ht 64.0 in | Wt 125.0 lb

## 2024-09-24 DIAGNOSIS — H9312 Tinnitus, left ear: Secondary | ICD-10-CM | POA: Diagnosis not present

## 2024-09-24 DIAGNOSIS — R42 Dizziness and giddiness: Secondary | ICD-10-CM

## 2024-09-24 MED ORDER — MECLIZINE HCL 25 MG PO TABS: 25.0000 mg | ORAL_TABLET | Freq: Three times a day (TID) | ORAL | 3 refills | Status: AC | PRN

## 2024-09-25 ENCOUNTER — Institutional Professional Consult (permissible substitution) (INDEPENDENT_AMBULATORY_CARE_PROVIDER_SITE_OTHER): Admitting: Physician Assistant

## 2024-09-25 NOTE — Progress Notes (Signed)
 Dear Dr. Mahlon, Here is my assessment for our mutual patient, Caitlin Velazquez. Thank you for allowing me the opportunity to care for your patient. Please do not hesitate to contact me should you have any other questions. Sincerely, Caitlin Cohen PA-C  Otolaryngology Clinic Note Referring provider: Dr. Mahlon HPI:  Caitlin Velazquez is a 28 y.o. female kindly referred by Dr. Mahlon   Discussed the use of AI scribe software for clinical note transcription with the patient, who gave verbal consent to proceed.  History of Present Illness   Caitlin Velazquez is a 28 year old female who presents with ringing in the ears and dizziness. She was referred by her primary care doctor for evaluation of ringing in the ears.  She has experienced ringing in her ears for at least a year, with increasing frequency. The ringing can occur spontaneously or be triggered by stress. She sometimes experiences nausea and headaches accompanying the ringing.  Dizziness is often associated with motion sickness, such as when moving too much or turning her head quickly. The dizziness worsened after a COVID-19 infection during a trip to [place], which she describes as 'long COVID' with symptoms of nausea and dizziness. No dizziness when getting up in the morning or rolling over in bed, but dizziness occurs when moving her head side to side quickly.  She has a history of nausea and motion sickness throughout her life, with an episode of passing out on a roller coaster a few years ago. 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  She experiences migraines approximately three times a month, which require her to 'shut everything off' and lay down. These migraines are sometimes accompanied by dizziness and are triggered by loud noises, such as a loud coworker, which also cause headaches.  She has tried promethazine for  nausea during her COVID-19 infection but stopped due to drowsiness. She has been prescribed meclizine  but has not yet tried it. She uses Claritin for seasonal allergies and occasionally uses saline for nasal dryness.  No neurologic problems such as numbness in the face or slurred speech. Reports one episode of seeing 'stars' attributed to hunger. No history of ear surgeries, trauma, or repeat ear infections. Reports possible decreased hearing in the left ear, but unsure if it is due to a phone issue.        Independent Review of Additional Tests or Records:  none   PMH/Meds/All/SocHx/FamHx/ROS:   Past Medical History:  Diagnosis Date   Allergy    ASCUS of cervix with negative high risk HPV    Constipation    Seasonal allergies      Past Surgical History:  Procedure Laterality Date   WISDOM TOOTH EXTRACTION      Family History  Problem Relation Age of Onset   Hypertension Mother    Heart disease Mother    Cancer Father        blood   Asthma Sister    Colon cancer Neg Hx    Stomach cancer Neg Hx    Esophageal cancer Neg Hx    Colon polyps Neg Hx      Social Connections: Moderately Isolated (08/25/2024)   Social Connection and Isolation Panel    Frequency of Communication with Friends and Family: Twice a week    Frequency of Social Gatherings with Friends and Family: Twice a week    Attends Religious Services: Patient declined  Active Member of Clubs or Organizations: No    Attends Engineer, Structural: Not on file    Marital Status: Married     Current Medications[1]   Physical Exam:   BP 96/65   Pulse 84   Temp 98.3 F (36.8 C)   Ht 5' 4 (1.626 m)   Wt 125 lb (56.7 kg)   LMP 08/06/2024 (Exact Date)   SpO2 99%   BMI 21.46 kg/m   Pertinent Findings  CN II-XII grossly intact-no nystagmus Bilateral EAC clear and TM intact with well pneumatized middle ear spaces Weber 512: equal Rinne 512: AC > BC b/l  Anterior rhinoscopy: Septum midline;  bilateral inferior turbinates with no hypertrophy No lesions of oral cavity/oropharynx; dentition thin normal limits No obviously palpable neck masses/lymphadenopathy/thyromegaly No respiratory distress or stridor  Seprately Identifiable Procedures:  None  Impression & Plans:  Caitlin Velazquez is a 28 y.o. female with the following   Assessment and Plan    Vertigo Intermittent dizziness with motion, stress, and loud noises. No hearing loss or neurological deficits. Differential includes BPPV,  and vertiginous migraines. Likely benign, no immediate MRI needed unless new symptoms arise. No red flags.  - Ordered hearing test  - Advised meclizine  as needed for acute dizziness, cautioning against prolonged use. - Educated on benign nature of symptoms and monitoring for new symptoms.  Tinnitus, left ear Intermittent left ear ringing for over a year, more frequent. No hearing loss or neurological deficits. - Ordered hearing test  - Advised symptom management with background or white noise. - Educated on benign nature of tinnitus and low likelihood of serious progression.       - f/u phone call with audio    Thank you for allowing me the opportunity to care for your patient. Please do not hesitate to contact me should you have any other questions.  Sincerely, Caitlin Cohen PA-C [facility] Phone: [phone] Fax: [phone]  09/25/2024, 2:18 PM        [1]  Current Outpatient Medications:    loratadine (CLARITIN) 10 MG tablet, Take 10 mg by mouth daily., Disp: , Rfl:    meclizine  (ANTIVERT ) 25 MG tablet, Take 1 tablet (25 mg total) by mouth 3 (three) times daily as needed for dizziness., Disp: 30 tablet, Rfl: 3   Vitamin D , Ergocalciferol , (DRISDOL ) 1.25 MG (50000 UNIT) CAPS capsule, Take 1 capsule (50,000 Units total) by mouth every 7 (seven) days., Disp: 12 capsule, Rfl: 0   meclizine  (ANTIVERT ) 25 MG tablet, Take 1 tablet (25 mg total) by mouth 3 (three)  times daily as needed for dizziness. (Patient not taking: Reported on 09/24/2024), Disp: 30 tablet, Rfl: 0

## 2024-09-28 DIAGNOSIS — F411 Generalized anxiety disorder: Secondary | ICD-10-CM | POA: Diagnosis not present

## 2024-11-06 ENCOUNTER — Ambulatory Visit (INDEPENDENT_AMBULATORY_CARE_PROVIDER_SITE_OTHER): Admitting: Audiology

## 2024-11-06 DIAGNOSIS — Z0110 Encounter for examination of ears and hearing without abnormal findings: Secondary | ICD-10-CM | POA: Diagnosis not present

## 2024-11-06 DIAGNOSIS — H93299 Other abnormal auditory perceptions, unspecified ear: Secondary | ICD-10-CM

## 2024-11-06 NOTE — Progress Notes (Signed)
" °  [address] [place], [place] [address] [phone]  Audiological Evaluation    Name: Genell Thede     DOB:   10-08-1996      MRN:   [id number]                                                                                     Service Date: 11/06/2024     Accompanied by: self    Patient comes today after Reyes Cohen, PA-C sent a referral for a hearing evaluation due to concerns with dizziness.   Symptoms Yes Details  Hearing loss  []    Tinnitus  [x]  Not today, intermittent and may show up in one ear or the other  Ear pain/ infections/pressure  []    Balance problems  [x]  Duration fluctuates- the first time lasted about 30 minutes and laying down helped - nausea and float sensation, off balance . Reports her balance issues may go together with her tinnitus. Reports experiences it lasts weekly.  For a month has been taking meclizine  which seems to have been helping.  Noise exposure history  []    Previous ear surgeries  []    Family history of hearing loss  []    Amplification  []    Other  [x]  headaches    Otoscopy: Right ear: clear external ear canal and notable landmarks visualized on the tympanic membrane. Left ear:  clear external ear canal and notable landmarks visualized on the tympanic membrane.  Tympanometry: Right ear: Type As - Normal external ear canal volume with normal middle ear pressure and low tympanic membrane compliance. Findings are consistent with reduced eardrum mobility. Left ear: Type As - Normal external ear canal volume with normal middle ear pressure and low tympanic membrane compliance. Findings are consistent with reduced eardrum mobility.  Hearing Evaluation The hearing test results were completed under headphones and results are deemed to be of good reliability. Test technique:  conventional    Pure tone Audiometry: Both ears- Normal hearing from 250 Hz - 8000 Hz.  Speech Audiometry: Right ear- Speech Reception Threshold  (SRT) was obtained at 10 dBHL. Left ear-Speech Reception Threshold (SRT) was obtained at 10 dBHL.   Word Recognition Score Tested using NU-6 (recorded) Right ear: 92% was obtained at a presentation level of 55 dBHL with contralateral masking which is deemed as  excellent. Left ear: 96% was obtained at a presentation level of 55 dBHL with contralateral masking which is deemed as  excellent.   Impression: There is a very slight difference in pure-tone thresholds, worse in the right ear.   Recommendations: Follow up with ENT as scheduled. Return for a hearing evaluation if concerns with hearing changes arise or per MD recommendation.   Mckenzee Beem MARIE LEROUX-MARTINEZ, AUD  "

## 2024-11-13 ENCOUNTER — Telehealth (INDEPENDENT_AMBULATORY_CARE_PROVIDER_SITE_OTHER): Payer: Self-pay

## 2024-11-13 NOTE — Telephone Encounter (Signed)
 Spoke to patient regarding hearing test results. Patient understood. I let the patient know that she can get her pharmacy to send us  a refill request when she needs one on her meclizine .

## 2025-09-17 ENCOUNTER — Encounter: Admitting: Family Medicine
